# Patient Record
Sex: Female | Born: 1945
Health system: Southern US, Community
[De-identification: ages and names within clinical notes are randomized; demographics above are authoritative.]

## PROBLEM LIST (undated history)

## (undated) DIAGNOSIS — I10 Essential (primary) hypertension: Secondary | ICD-10-CM

## (undated) DIAGNOSIS — F329 Major depressive disorder, single episode, unspecified: Secondary | ICD-10-CM

## (undated) DIAGNOSIS — K5792 Diverticulitis of intestine, part unspecified, without perforation or abscess without bleeding: Secondary | ICD-10-CM

## (undated) DIAGNOSIS — E119 Type 2 diabetes mellitus without complications: Secondary | ICD-10-CM

## (undated) DIAGNOSIS — R32 Unspecified urinary incontinence: Secondary | ICD-10-CM

## (undated) HISTORY — DX: Type 2 diabetes mellitus without complications: E11.9

## (undated) HISTORY — PX: IR FALLOPIAN TUBE CATHETERIZATION: IMG633

## (undated) HISTORY — PX: EYE SURGERY: SHX253

## (undated) HISTORY — PX: TONSILLECTOMY: SUR1361

---

## 2011-12-18 ENCOUNTER — Other Ambulatory Visit: Payer: Self-pay

## 2011-12-18 ENCOUNTER — Encounter: Payer: Self-pay | Admitting: Emergency Medicine

## 2011-12-18 ENCOUNTER — Emergency Department (HOSPITAL_COMMUNITY)
Admission: EM | Admit: 2011-12-18 | Discharge: 2011-12-18 | Disposition: A | Discharge status: Home / Self Care | Payer: Medicare Other | Attending: Emergency Medicine | Admitting: Emergency Medicine

## 2011-12-18 DIAGNOSIS — I10 Essential (primary) hypertension: Secondary | ICD-10-CM | POA: Insufficient documentation

## 2011-12-18 DIAGNOSIS — F329 Major depressive disorder, single episode, unspecified: Secondary | ICD-10-CM | POA: Insufficient documentation

## 2011-12-18 DIAGNOSIS — M5412 Radiculopathy, cervical region: Secondary | ICD-10-CM | POA: Insufficient documentation

## 2011-12-18 DIAGNOSIS — M542 Cervicalgia: Secondary | ICD-10-CM | POA: Insufficient documentation

## 2011-12-18 DIAGNOSIS — M5030 Other cervical disc degeneration, unspecified cervical region: Secondary | ICD-10-CM | POA: Insufficient documentation

## 2011-12-18 DIAGNOSIS — M25519 Pain in unspecified shoulder: Secondary | ICD-10-CM | POA: Insufficient documentation

## 2011-12-18 DIAGNOSIS — Z79899 Other long term (current) drug therapy: Secondary | ICD-10-CM | POA: Insufficient documentation

## 2011-12-18 HISTORY — DX: Major depressive disorder, single episode, unspecified: F32.9

## 2011-12-18 HISTORY — DX: Essential (primary) hypertension: I10

## 2011-12-18 MED ORDER — DEXAMETHASONE 6 MG PO TABS: ORAL_TABLET | ORAL | Status: AC

## 2011-12-18 MED ORDER — DEXAMETHASONE SODIUM PHOSPHATE 4 MG/ML IJ SOLN
8.0000 mg | Freq: Once | INTRAMUSCULAR | Status: AC
  Administered 2011-12-18: 8 mg via INTRAMUSCULAR
  Filled 2011-12-18: qty 2

## 2011-12-18 MED ORDER — HYDROCODONE-ACETAMINOPHEN 5-325 MG PO TABS
2.0000 | ORAL_TABLET | Freq: Once | ORAL | Status: AC
  Administered 2011-12-18: 2 via ORAL
  Filled 2011-12-18: qty 2

## 2011-12-18 MED ORDER — HYDROCODONE-ACETAMINOPHEN 7.5-325 MG PO TABS: 1.0000 | ORAL_TABLET | ORAL | Status: AC | PRN

## 2011-12-18 MED ORDER — ONDANSETRON HCL 4 MG PO TABS
4.0000 mg | ORAL_TABLET | Freq: Once | ORAL | Status: AC
  Administered 2011-12-18: 4 mg via ORAL
  Filled 2011-12-18: qty 1

## 2011-12-18 NOTE — ED Notes (Signed)
Patient is alert and oriented x 4 with respirations even and unlabored.  NAD at this time.  Discharge instructions reviewed with patient and patient verbalized understanding.  Pt ambulated to lobby with steady gait, and friend to transport pt home.  

## 2011-12-18 NOTE — ED Notes (Signed)
Patient c/o right sided neck pain and right shoulder pain that comes and goes for a little over a week.  Denies chest pain, shortness of breath, N/V/D.

## 2011-12-18 NOTE — ED Provider Notes (Signed)
History     CSN: [id number]  Arrival date & time 12/18/11  2106   First MD Initiated Contact with Patient 12/18/11 2222      Chief Complaint  Patient presents with  . Neck Pain  . Shoulder Pain    (Consider location/radiation/quality/duration/timing/severity/associated sxs/prior treatment) HPI Comments: Patient reports history of degenerative cervical disc disease approximately 6-7 years ago. She was treated with steroid injections approximately a year ago. She has not had any major problems since that time. The patient now complains of right sided neck and shoulder area pain that seems to come and go has been worse over the past week. The patient denies dropping objects. She denies any excessive lifting, pushing, pulling, or repetitive use. She has not had previous surgical interventions involving the cervical spine or the right shoulder. The patient denies any chest pain, shortness of breath, sweats, nausea vomiting, or loss of consciousness.  Patient is a 65 y.o. female presenting with neck pain and shoulder pain. The history is provided by the patient.  Neck Pain  The current episode started more than 1 week ago. The problem occurs daily. The problem has been gradually worsening. The pain is associated with an unknown factor. There has been no fever. The pain is present in the right side. The quality of the pain is described as shooting and aching. The pain radiates to the right shoulder. The pain is severe. Exacerbated by: Certain movement or positions. The pain is the same all the time. Pertinent negatives include no photophobia, no chest pain, no syncope, no numbness, no headaches, no bowel incontinence and no bladder incontinence. She has tried nothing for the symptoms.  Shoulder Pain Associated symptoms include neck pain. Pertinent negatives include no abdominal pain, arthralgias, chest pain, coughing, headaches or numbness.    Past Medical History  Diagnosis Date  . Hypertension    . Depression, major     Past Surgical History  Procedure Date  . Eye surgery     No family history on file.  History  Substance Use Topics  . Smoking status: Former Games developer  . Smokeless tobacco: Not on file  . Alcohol Use: Yes    OB History    Grav Para Term Preterm Abortions TAB SAB Ect Mult Living                  Review of Systems  Constitutional: Negative for activity change.       All ROS Neg except as noted in HPI  HENT: Positive for neck pain. Negative for nosebleeds.   Eyes: Negative for photophobia and discharge.  Respiratory: Negative for cough, shortness of breath and wheezing.   Cardiovascular: Negative for chest pain, palpitations and syncope.  Gastrointestinal: Negative for abdominal pain, blood in stool and bowel incontinence.  Genitourinary: Negative for bladder incontinence, dysuria, frequency and hematuria.  Musculoskeletal: Negative for back pain and arthralgias.  Skin: Negative.   Neurological: Negative for dizziness, seizures, speech difficulty, numbness and headaches.  Psychiatric/Behavioral: Negative for hallucinations and confusion.    Allergies  Amoxicillin; Erythromycin; Sulfa antibiotics; and Tetanus toxoids  Home Medications   Current Outpatient Rx  Name Route Sig Dispense Refill  . ATENOLOL-CHLORTHALIDONE 50-25 MG PO TABS Oral Take 1 tablet by mouth daily.      Marland Kitchen BROMFENAC SODIUM (ONCE-DAILY) 0.09 % OP SOLN Ophthalmic Apply 1 drop to eye daily.      . BUPROPION HCL ER (SR) PO Oral Take by mouth daily.      Marland Kitchen  CLONAZEPAM PO Oral Take 1 tablet by mouth daily as needed. FOR ANXIETY     . PRILOSEC PO Oral Take 1 capsule by mouth daily.      Marland Kitchen PREDNISOLONE ACETATE 0.12 % OP SUSP Both Eyes Place 1 drop into both eyes 4 (four) times daily.      Marland Kitchen DETROL PO Oral Take 1 tablet by mouth daily.        BP 136/73  Pulse 63  Temp(Src) 98.1 F (36.7 C) (Oral)  Resp 20  Ht 5' 7.5" (1.715 m)  Wt 176 lb (79.833 kg)  BMI 27.16 kg/m2  SpO2  99%  Physical Exam  Nursing note and vitals reviewed. Constitutional: She is oriented to person, place, and time. She appears well-developed and well-nourished.  Non-toxic appearance.  HENT:  Head: Normocephalic.  Right Ear: Tympanic membrane and external ear normal.  Left Ear: Tympanic membrane and external ear normal.  Eyes: EOM and lids are normal. Pupils are equal, round, and reactive to light.  Neck: Normal range of motion. Neck supple. Carotid bruit is not present.  Cardiovascular: Normal rate, regular rhythm, normal heart sounds, intact distal pulses and normal pulses.  Exam reveals no gallop and no friction rub.   Pulmonary/Chest: Breath sounds normal. No stridor. No respiratory distress. She has no rales. She exhibits no tenderness.  Abdominal: Soft. Bowel sounds are normal. There is no tenderness. There is no guarding.  Musculoskeletal: Normal range of motion.       There is soreness with attempted range of motion of the right shoulder. There is increased tightness and tenseness from the insertion of the trapezius superiorly to the right shoulder. No hot joints appreciated. Distal pulses appreciated and symmetrical.  Lymphadenopathy:       Head (right side): No submandibular adenopathy present.       Head (left side): No submandibular adenopathy present.    She has no cervical adenopathy.  Neurological: She is alert and oriented to person, place, and time. She has normal strength. No cranial nerve deficit or sensory deficit.  Skin: Skin is warm and dry.  Psychiatric: She has a normal mood and affect. Her speech is normal.    ED Course  Procedures (including critical care time) Pulse oximetry 99% on room air. Within normal limits by my interpretation. Labs Reviewed - No data to display No results found. EKG: Time: 9:20 p.m. Rate. 61. Rhythm. Sinus rhythm with premature ventricular complexes. P waves. Left atrial enlargement. QRS: Slow R-wave progression V1 through V4. No STEMI.  No apparent life-threatening event.  Dx: 1. Cervical radiculopathy   MDM  I have reviewed nursing notes, vital signs, and all appropriate lab and imaging results for this patient.   Pt to see her MD in [place], [place]. For recheck next week. Rx for decadron and norco given.EKG reviewed with patient. Patient states she has a history of PVCs, and EKG changes, and these are not new.     Kathie Dike, PA 12/18/11 2312  Kathie Dike, [place] 12/19/11 [phone]

## 2011-12-19 NOTE — ED Provider Notes (Signed)
Medical screening examination/treatment/procedure(s) were performed by non-physician practitioner and as supervising physician I was immediately available for consultation/collaboration.   Osmara Drummonds L Savita Runner, MD 12/19/11 1524 

## 2020-07-19 ENCOUNTER — Other Ambulatory Visit (HOSPITAL_COMMUNITY): Payer: Self-pay | Admitting: Family Medicine

## 2020-07-19 DIAGNOSIS — Z780 Asymptomatic menopausal state: Secondary | ICD-10-CM

## 2020-07-26 ENCOUNTER — Ambulatory Visit (HOSPITAL_COMMUNITY)
Admission: RE | Admit: 2020-07-26 | Discharge: 2020-07-26 | Disposition: A | Discharge status: Home / Self Care | Payer: Medicare PPO | Source: Ambulatory Visit | Attending: Family Medicine | Admitting: Family Medicine

## 2020-07-26 ENCOUNTER — Other Ambulatory Visit: Payer: Self-pay

## 2020-07-26 DIAGNOSIS — Z780 Asymptomatic menopausal state: Secondary | ICD-10-CM | POA: Diagnosis not present

## 2020-08-11 ENCOUNTER — Ambulatory Visit
Admission: EM | Admit: 2020-08-11 | Discharge: 2020-08-11 | Disposition: A | Discharge status: Home / Self Care | Payer: Medicare PPO | Attending: Family Medicine | Admitting: Family Medicine

## 2020-08-11 ENCOUNTER — Encounter: Payer: Self-pay | Admitting: Emergency Medicine

## 2020-08-11 DIAGNOSIS — R82998 Other abnormal findings in urine: Secondary | ICD-10-CM | POA: Insufficient documentation

## 2020-08-11 DIAGNOSIS — R1032 Left lower quadrant pain: Secondary | ICD-10-CM | POA: Insufficient documentation

## 2020-08-11 HISTORY — DX: Unspecified urinary incontinence: R32

## 2020-08-11 LAB — POCT URINALYSIS DIP (MANUAL ENTRY)
Blood, UA: NEGATIVE
Glucose, UA: NEGATIVE mg/dL
Nitrite, UA: NEGATIVE
Protein Ur, POC: 30 mg/dL — AB
Spec Grav, UA: >=1.03 — AB (ref 1.010–1.025)
Urobilinogen, UA: 0.2 E.U./dL
pH, UA: 6 (ref 5.0–8.0)

## 2020-08-11 NOTE — ED Provider Notes (Signed)
[facility]   CC: UTI  SUBJECTIVE:  Lindsay Wilkerson is a 74 y.o. female who complains of dark urine for the last 3 days. Reports that she is on antibiotics for diverticulitis. Patient denies a precipitating event, recent sexual encounter, excessive caffeine intake. Has not taken OTC medications for this.  Symptoms are made worse with urination. Admits to similar symptoms in the past. Denies fever, chills, nausea, vomiting, abdominal pain, flank pain, abnormal vaginal discharge or bleeding, hematuria.    LMP: No LMP recorded. Patient is postmenopausal.  ROS: As in HPI.  All other pertinent ROS negative.     Past Medical History:  Diagnosis Date   Depression, major    Hypertension    Urinary bladder incontinence    Past Surgical History:  Procedure Laterality Date   EYE SURGERY     Allergies  Allergen Reactions   Amoxicillin    Erythromycin    Sulfa Antibiotics    Tetanus Toxoids    Tussionex Pennkinetic Er [Hydrocod Polst-Cpm Polst Er] Itching   No current facility-administered medications on file prior to encounter.   Current Outpatient Medications on File Prior to Encounter  Medication Sig Dispense Refill   atenolol-chlorthalidone (TENORETIC) 50-25 MG per tablet Take 1 tablet by mouth daily.       Bromfenac Sodium (BROMDAY) 0.09 % SOLN Apply 1 drop to eye daily.       BUPROPION HCL ER, SR, PO Take by mouth daily.       CLONAZEPAM PO Take 1 tablet by mouth daily as needed. FOR ANXIETY      Omeprazole (PRILOSEC PO) Take 1 capsule by mouth daily.       prednisoLONE acetate (PRED MILD) 0.12 % ophthalmic suspension Place 1 drop into both eyes 4 (four) times daily.       Tolterodine Tartrate (DETROL PO) Take 1 tablet by mouth daily.       Social History   Socioeconomic History   Marital status: Divorced    Spouse name: Not on file   Number of children: Not on file   Years of education: Not on file   Highest education level: Not on file    Occupational History   Not on file  Tobacco Use   Smoking status: Former Smoker  Substance and Sexual Activity   Alcohol use: Yes   Drug use: No   Sexual activity: Not on file  Other Topics Concern   Not on file  Social History Narrative   Not on file   Social Determinants of Health   Financial Resource Strain:    Difficulty of Paying Living Expenses: Not on file  Food Insecurity:    Worried About [employer] in the Last Year: Not on file   Ran Out of Food in the Last Year: Not on file  Transportation Needs:    Lack of Transportation (Medical): Not on file   Lack of Transportation (Non-Medical): Not on file  Physical Activity:    Days of Exercise per Week: Not on file   Minutes of Exercise per Session: Not on file  Stress:    Feeling of Stress : Not on file  Social Connections:    Frequency of Communication with Friends and Family: Not on file   Frequency of Social Gatherings with Friends and Family: Not on file   Attends Religious Services: Not on file   Active Member of Clubs or Organizations: Not on file   Attends Archivist Meetings: Not on file  Marital Status: Not on file  Intimate Partner Violence:    Fear of Current or Ex-Partner: Not on file   Emotionally Abused: Not on file   Physically Abused: Not on file   Sexually Abused: Not on file   No family history on file.  OBJECTIVE:  Vitals:   08/11/20 1954 08/11/20 1956  BP:  127/79  Pulse:  66  Resp:  19  Temp:  98.8 F (37.1 C)  TempSrc:  Oral  SpO2:  97%  Weight: 202 lb (91.6 kg)   Height: 5\' 8"  (1.727 m)    General appearance: AOx3 in no acute distress HEENT: NCAT. Oropharynx clear.  Lungs: clear to auscultation bilaterally without adventitious breath sounds Heart: regular rate and rhythm. Radial pulses 2+ symmetrical bilaterally Abdomen: soft; non-distended; no tenderness; bowel sounds present; no guarding or rebound tenderness Back: no CVA  tenderness Extremities: no edema; symmetrical with no gross deformities Skin: warm and dry Neurologic: Ambulates from chair to exam table without difficulty Psychological: alert and cooperative; normal mood and affect  Labs Reviewed  POCT URINALYSIS DIP (MANUAL ENTRY) - Abnormal; Notable for the following components:      Result Value   Color, UA straw (*)    Bilirubin, UA small (*)    Ketones, POC UA trace (5) (*)    Spec Grav, UA >=1.030 (*)    Protein Ur, POC =30 (*)    Leukocytes, UA Trace (*)    All other components within normal limits  URINE CULTURE  POCT URINALYSIS DIP (MANUAL ENTRY)    ASSESSMENT & PLAN:  1. Dark urine   2. Left lower quadrant abdominal pain     Urine culture sent  We will call you with abnormal results that need further treatment Push fluids and get plenty of rest Take pyridium as prescribed and as needed for symptomatic relief Follow up with PCP if symptoms persists Return here or go to ER if you have any new or worsening symptoms such as fever, worsening abdominal pain, nausea/vomiting, flank pain  Outlined signs and symptoms indicating need for more acute intervention Patient verbalized understanding After Visit Summary given     Faustino Congress, NP 08/13/20 [phone]

## 2020-08-11 NOTE — Discharge Instructions (Addendum)
You may have a urinary tract infection. We are going to culture your urine and will call you as soon as we have the results.   Drink plenty of water, 8-10 glasses per day.   You may take AZO over the counter for painful urination.  Follow up with your primary care provider as needed.   Go to the Emergency Department if you experience severe pain, shortness of breath, high fever, or other concerns.   

## 2020-08-11 NOTE — ED Triage Notes (Signed)
Pt states she is on abx for diverticulitis. States her urine looks brown looking in the toilet. Denies burning or increased frequency.  Does report some vaginal itching that has been going on a while.

## 2020-08-14 LAB — URINE CULTURE: Culture: NO GROWTH

## 2020-10-05 ENCOUNTER — Other Ambulatory Visit: Payer: Self-pay

## 2020-10-05 ENCOUNTER — Ambulatory Visit (HOSPITAL_COMMUNITY)
Admission: RE | Admit: 2020-10-05 | Discharge: 2020-10-05 | Disposition: A | Discharge status: Home / Self Care | Payer: Medicare PPO | Source: Ambulatory Visit | Attending: Family Medicine | Admitting: Family Medicine

## 2020-10-05 ENCOUNTER — Other Ambulatory Visit (HOSPITAL_COMMUNITY): Payer: Self-pay | Admitting: Family Medicine

## 2020-10-05 ENCOUNTER — Other Ambulatory Visit: Payer: Self-pay | Admitting: Family Medicine

## 2020-10-05 ENCOUNTER — Encounter (HOSPITAL_COMMUNITY): Payer: Self-pay | Admitting: Radiology

## 2020-10-05 DIAGNOSIS — K5792 Diverticulitis of intestine, part unspecified, without perforation or abscess without bleeding: Secondary | ICD-10-CM

## 2020-10-05 MED ORDER — IOHEXOL 9 MG/ML PO SOLN
ORAL | Status: AC
  Filled 2020-10-05: qty 1000

## 2020-10-05 MED ORDER — IOHEXOL 300 MG/ML  SOLN
100.0000 mL | Freq: Once | INTRAMUSCULAR | Status: AC | PRN
  Administered 2020-10-05: 100 mL via INTRAVENOUS

## 2020-11-07 ENCOUNTER — Ambulatory Visit (INDEPENDENT_AMBULATORY_CARE_PROVIDER_SITE_OTHER): Payer: Medicare PPO | Admitting: Clinical

## 2020-11-07 ENCOUNTER — Other Ambulatory Visit: Payer: Self-pay

## 2020-11-07 DIAGNOSIS — F419 Anxiety disorder, unspecified: Secondary | ICD-10-CM | POA: Diagnosis not present

## 2020-11-07 DIAGNOSIS — F331 Major depressive disorder, recurrent, moderate: Secondary | ICD-10-CM
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
## 2020-11-13 ENCOUNTER — Encounter (INDEPENDENT_AMBULATORY_CARE_PROVIDER_SITE_OTHER): Payer: Self-pay | Admitting: Gastroenterology

## 2020-11-13 ENCOUNTER — Other Ambulatory Visit: Payer: Self-pay

## 2020-11-13 ENCOUNTER — Ambulatory Visit (INDEPENDENT_AMBULATORY_CARE_PROVIDER_SITE_OTHER): Payer: Medicare PPO | Admitting: Gastroenterology

## 2020-11-13 DIAGNOSIS — R1032 Left lower quadrant pain: Secondary | ICD-10-CM | POA: Insufficient documentation

## 2020-11-13 MED ORDER — NAPROXEN 500 MG PO TABS: 500.0000 mg | ORAL_TABLET | Freq: Two times a day (BID) | ORAL | 0 refills | Status: DC

## 2020-11-13 NOTE — Progress Notes (Signed)
Maylon Peppers, M.D. Gastroenterology & Hepatology [facility] [address] [place], [place] [address] Primary Care Physician: Youlanda Roys, MD [place] [address]  Referring MD: PCP  I will communicate my assessment and recommendations to the referring MD via EMR. Note: Occasional unusual wording and randomly placed punctuation marks may result from the use of speech recognition technology to transcribe this document"  Chief Complaint:  PCP  History of Present Illness: Lindsay Wilkerson is a 74 y.o. female with PMH diverticulitis x2, depression and HTN, who presents for evaluation of LLQ pain.  Patient reports that she has had chronic abdominal pain in her LLQ since 03/2020, which is present during most of the whole day, but gets worse with leaning on the left side of her body and in the morning.  She actually reported that she moves her body the pain exacerbates. It does not bother her while sleeping. States that the pain radiates to her L flank and LUQ. The patient states the pain is 6/10 in intensity and describes it as a pressure. She usually does not take something for the pain but has tried Tylenol a couple of times in the past. Never had similar symptoms in the past prior to symptom onset.  She reports that in July 2021 she was prescribed two antibiotics by her PCP for pain management for presumptive diverticulitis, but she did not have any imaging done at that time - did not feel any improvement with the medication.  The patient denies having any nausea, vomiting, fever, chills, hematochezia, melena, hematemesis, abdominal distention, diarrhea, jaundice, pruritus or weight loss.  Patient had abdominal imaging performed recently as part of the investigation of her persistent abdominal pain which reports CT abdomen with IV contrast 10/05/2020 IMPRESSION: 1. Mild circumferential thickening of the urinary bladder  wall, possibly related to underdistention.  2. Colonic diverticulosis without evidence of acute diverticulitis. 3. No other acute abdominopelvic abnormality to provide cause for patient's symptoms. 4. Aortic Atherosclerosis (ICD10-I70.0).  Last HCW:CBJSE Last Colonoscopy:2018 - had polyps and advised to repeat in 5 years.  FHx: neg for any gastrointestinal/liver disease, sister breast cancer Social: quit smoking in 2001, neg alcohol or illicit drug use Surgical: partial fallopian tube removed  Past Medical History: Past Medical History:  Diagnosis Date  . Depression, major   . Hypertension   . Urinary bladder incontinence     Past Surgical History: Past Surgical History:  Procedure Laterality Date  . EYE SURGERY      Family History:No family history on file.  Social History: Social History   Tobacco Use  Smoking Status Former Smoker   Social History   Substance and Sexual Activity  Alcohol Use Yes   Social History   Substance and Sexual Activity  Drug Use No    Allergies: Allergies  Allergen Reactions  . Amoxicillin   . Erythromycin   . Sulfa Antibiotics   . Tetanus Toxoids   . Tussionex Pennkinetic Er [Hydrocod Polst-Cpm Polst Er] Itching    Medications: Current Outpatient Medications  Medication Sig Dispense Refill  . atenolol-chlorthalidone (TENORETIC) 50-25 MG per tablet Take 1 tablet by mouth daily.      . Bromfenac Sodium (BROMDAY) 0.09 % SOLN Apply 1 drop to eye daily.      . BUPROPION HCL ER, SR, PO Take by mouth daily.      Marland Kitchen CLONAZEPAM PO Take 1 tablet by mouth daily as needed. FOR ANXIETY     .  Omeprazole (PRILOSEC PO) Take 1 capsule by mouth daily.      . prednisoLONE acetate (PRED MILD) 0.12 % ophthalmic suspension Place 1 drop into both eyes 4 (four) times daily.      . Tolterodine Tartrate (DETROL PO) Take 1 tablet by mouth daily.       No current facility-administered medications for this visit.    Review of Systems: GENERAL:  negative for malaise, night sweats HEENT: No changes in hearing or vision, no nose bleeds or other nasal problems. NECK: Negative for lumps, goiter, pain and significant neck swelling RESPIRATORY: Negative for cough, wheezing CARDIOVASCULAR: Negative for chest pain, leg swelling, palpitations, orthopnea GI: SEE HPI MUSCULOSKELETAL: Negative for joint pain or swelling, back pain, and muscle pain. SKIN: Negative for lesions, rash PSYCH: Negative for sleep disturbance, mood disorder and recent psychosocial stressors. HEMATOLOGY Negative for prolonged bleeding, bruising easily, and swollen nodes. ENDOCRINE: Negative for cold or heat intolerance, polyuria, polydipsia and goiter. NEURO: negative for tremor, gait imbalance, syncope and seizures. The remainder of the review of systems is noncontributory.   Physical Exam: BP 125/82 (BP Location: Right Arm, Patient Position: Sitting, Cuff Size: Large)   Pulse 71   Temp 98.3 F (36.8 C) (Oral)   Ht 5\' 8"  (1.727 m)   Wt 209 lb (94.8 kg)   BMI 31.78 kg/m  GENERAL: The patient is AO x3, in no acute distress. HEENT: Head is normocephalic and atraumatic. EOMI are intact. Mouth is well hydrated and without lesions. NECK: Supple. No masses LUNGS: Clear to auscultation. No presence of rhonchi/wheezing/rales. Adequate chest expansion HEART: RRR, normal s1 and s2. ABDOMEN: Mildly tender to palpation in the left supra inguinal area, no guarding, no peritoneal signs, and nondistended. BS +. No masses. EXTREMITIES: Without any cyanosis, clubbing, rash, lesions or edema. NEUROLOGIC: AOx3, no focal motor deficit. SKIN: no jaundice, no rashes   Imaging/Labs: as above  I personally reviewed and interpreted the available labs, imaging and endoscopic files.  Impression and Plan: Lindsay Wilkerson is a 74 y.o. female with PMH diverticulitis x2, depression and HTN, who presents for evaluation of LLQ pain.  Patient has presented persistent symptoms of left  lower quadrant abdominal pain.  Based on the description of her symptoms, it is most likely that the etiology is related to a muscular skeletal source as the pain is exacerbated with movements.  She had recent abdominal imaging that was negative for any alterations explain her symptoms, while she had a relatively recent colonoscopy 3 years ago that did not show any major abnormalities.  She has not presented any red flag signs.  Due to this, we will give her a trial of NSAIDs for 3 weeks while she is taking her Prilosec daily.  If she does not have any significant improvement of her symptoms, alternative school be trying an antispasmodic such as Bentyl for management of irritable bowel syndrome.  The patient understood and agreed.  -Start naproxen 500 mg BID trial for 3 weeks - Continue Prilosec daily - RTC 2 months  All questions were answered.      Maylon Peppers, MD Gastroenterology and Hepatology [facility]

## 2020-11-13 NOTE — Patient Instructions (Addendum)
Start Aleve trial for 3 weeks Continue Prilosec daily

## 2020-11-30 ENCOUNTER — Other Ambulatory Visit: Payer: Self-pay

## 2020-11-30 ENCOUNTER — Ambulatory Visit (INDEPENDENT_AMBULATORY_CARE_PROVIDER_SITE_OTHER): Payer: Medicare PPO | Admitting: Clinical

## 2020-11-30 DIAGNOSIS — F331 Major depressive disorder, recurrent, moderate: Secondary | ICD-10-CM | POA: Diagnosis not present

## 2020-11-30 DIAGNOSIS — F419 Anxiety disorder, unspecified: Secondary | ICD-10-CM | POA: Diagnosis not present

## 2020-11-30 NOTE — Progress Notes (Signed)
  Virtual Visit via Telephone Note  I connected withOllie Wilkerson 12/09/21at 11:00 AM ESTby telephoneand verified that I am speaking with the correct person using two identifiers.  Location: Patient:Home Provider:Office  I discussed the limitations, risks, security and privacy concerns of performing an evaluation and management service by telephone and the availability of in person appointments. I also discussed with the patient that there may be a patient responsible charge related to this service. The patient expressed understanding and agreed to proceed.   THERAPIST PROGRESS NOTE  Session Time:11:00AM-11:45PM  Participation Level:Active  Behavioral Response:CasualAlertDepressed  Type of Therapy:Individual Therapy  Treatment Goals addressed:Coping  Interventions:Motivational Interviewing, Solution Focused, Strength-based, Supportive and Anger Management Training  Summary:Lindsay Gatherightis [phone] presents withDepression and Anxiety.The OPT therapist worked with thepatientfor herinitial OPT treatment. The OPT therapist utilized Motivational Interviewing to assist in creating therapeutic repore. The patient in the session was engaged and work in Science writer about hertriggers and symptoms over the past few weeksincludingher difficulty and stress around seeking a serious relationship and adjusting to recently moving.The OPT therapist worked reviewingcoping strategies to assist in management ofDepression/Anxiety.The OPT therapist worked with the patient to examine herutilization ofsupport system and promoted leisure/self care.The OPT therapist provided psycho-education and support throughout the treatment session.  Suicidal/Homicidal:Nowithout intent/plan  Therapist Response:. TheOPT therapist gained feedback through utilizing [place] into the patientstriggers, symptoms, and behavior  responses over the pastfewweeks.The OPT therapist was able to getfeedbackabout the patientsdifficultywith adjusting to recently moving and finding compatibility in seeking a serious relationship.The OPT therapist worked with the patient using Cognitive Behavioral Therapy exercise within the session. The patientverbalized that she hasbeen working to organize her home so that she can be more social and invite others over to her home.The OPT therapist continued to work with the patient on positive thinkingand giving herself grace and time to organize after just recently moving.The OPT therapist will continue treatment work with the patient in hernext scheduled session.   Plan: Return again in2/3weeks.  Diagnosis:Axis I:Recurrent moderate major depressive disorder with anxiety  Axis II:No diagnosis  I discussed the assessment and treatment plan with the patient. The patient was provided an opportunity to ask questions and all were answered. The patient agreed with the plan and demonstrated an understanding of the instructions.  The patient was advised to call back or seek an in-person evaluation if the symptoms worsen or if the condition fails to improve as anticipated.  I provided52minutes of non-face-to-face time during this encounter.  Lindsay Grumbles, LCSW 11/30/2020

## 2020-12-01 ENCOUNTER — Other Ambulatory Visit (INDEPENDENT_AMBULATORY_CARE_PROVIDER_SITE_OTHER): Payer: Self-pay | Admitting: Gastroenterology

## 2020-12-01 DIAGNOSIS — R1032 Left lower quadrant pain: Secondary | ICD-10-CM

## 2020-12-01 NOTE — Telephone Encounter (Signed)
No further refills are appropiate, medication only meant to be given for 3 weeks.  Thanks  Maylon Peppers, MD Gastroenterology and Hepatology [facility]

## 2020-12-21 ENCOUNTER — Ambulatory Visit (INDEPENDENT_AMBULATORY_CARE_PROVIDER_SITE_OTHER): Payer: Medicare PPO | Admitting: Clinical

## 2020-12-21 ENCOUNTER — Other Ambulatory Visit: Payer: Self-pay

## 2020-12-21 DIAGNOSIS — F419 Anxiety disorder, unspecified: Secondary | ICD-10-CM

## 2020-12-21 DIAGNOSIS — F331 Major depressive disorder, recurrent, moderate: Secondary | ICD-10-CM | POA: Diagnosis not present

## 2020-12-21 NOTE — Progress Notes (Signed)
Virtual Visit via Video Note  I connected with Lindsay Wilkerson on 12/21/20 at  2:00 PM EST by a video enabled telemedicine application and verified that I am speaking with the correct person using two identifiers.  Location: Patient: Home Provider: Office   I discussed the limitations of evaluation and management by telemedicine and the availability of in person appointments. The patient expressed understanding and agreed to proceed.    THERAPIST PROGRESS NOTE  Session Time:2:00PM - 2:55PM  Participation Level:Active  Behavioral Response:CasualAlertDepressed  Type of Therapy:Individual Therapy  Treatment Goals addressed:Coping  Interventions:Motivational Interviewing, Solution Focused, Strength-based, Supportive and Anger Management Training  Summary:Lindsay Gatherightis [phone].o.femalewho presents withDepression and Anxiety.The OPT therapist worked with thepatientfor herongoing OPT treatment. The OPT therapist utilized Motivational Interviewing to assist in creating therapeutic repore. The patient in the session was engaged and work in Tour manager about hertriggers and symptoms over the past few weeksincludingherdifficulty and stress around ongoing involvement with a female friend who she enjoys the company of but is not on the same page as the patient who is seeking a more traditional monogamous relationship.The OPT therapist worked reviewingcoping strategies to assist in management ofDepression/Anxiety.The OPT therapist worked with the patient to examine herutilization ofsupport systemand promoted leisure/self care.The OPT therapist provided psycho-education and support throughout the treatment session.  Suicidal/Homicidal:Nowithout intent/plan  Therapist Response:. TheOPT therapist gained feedback through utilizing Motivational Interviewing into the patientstriggers, symptoms, and behavior responses over the  pastfewweeks.The OPT therapist was able to getfeedbackabout the patientsdifficultywithadjusting to recently moving and feel of losing the companionship she is getting from her involvement with a female shes interested in, but knowing the person is not a good fit for what she is ultimately looking for in seeking a serious relationship.The OPT therapist worked with the patient using Cognitive Behavioral Therapy exercise within the session. The patientverbalized that she is looking to be more social and invite others over to her home.The OPT therapist continued to work with the patient on positive thinkingandgiving herself a chance to cultivate social relationships to fill her desire for socialization and from this she may find companionship.The OPT therapist will continue treatment work with the patient in hernext scheduled session.   Plan: Return again in2/3weeks.  Diagnosis:Axis I:Recurrent moderate major depressive disorder with anxiety  Axis II:No diagnosis  I discussed the assessment and treatment plan with the patient. The patient was provided an opportunity to ask questions and all were answered. The patient agreed with the plan and demonstrated an understanding of the instructions.  The patient was advised to call back or seek an in-person evaluation if the symptoms worsen or if the condition fails to improve as anticipated.  I provided20minutes of non-face-to-face time during this encounter.  Winfred Burn, LCSW 12/21/2020

## 2021-01-11 ENCOUNTER — Ambulatory Visit (INDEPENDENT_AMBULATORY_CARE_PROVIDER_SITE_OTHER): Payer: Medicare PPO | Admitting: Clinical

## 2021-01-11 ENCOUNTER — Telehealth (HOSPITAL_COMMUNITY): Payer: Self-pay | Admitting: Clinical

## 2021-01-11 ENCOUNTER — Other Ambulatory Visit: Payer: Self-pay

## 2021-01-11 DIAGNOSIS — F331 Major depressive disorder, recurrent, moderate: Secondary | ICD-10-CM

## 2021-01-11 DIAGNOSIS — F419 Anxiety disorder, unspecified: Secondary | ICD-10-CM

## 2021-01-11 NOTE — Telephone Encounter (Signed)
Called patient due to provider advising pt was "upset" and had to be de-escalated because her appt time changed from 3pm 1/18 to 8am 1/19. I called pt immediately after her 8am appt, due to providers statement. Patient advised she was not upset at all and will mention to provider on the next appt., adding that she was fine and had just asked about original time that was given.

## 2021-01-11 NOTE — Progress Notes (Signed)
  Virtual Visit via Video Note  I connected withOllie Wilkerson on 01/11/21 at  8:00 AM EST by a video enabled telemedicine application and verified that I am speaking with the correct person using two identifiers.  Location: Patient: Home Provider: Office  I discussed the limitations of evaluation and management by telemedicine and the availability of in person appointments. The patient expressed understanding and agreed to proceed.    THERAPIST PROGRESS NOTE  Session Time:8:00AM - 8:55AM  Participation Level:Active  Behavioral Response:CasualAlertDepressed  Type of Therapy:Individual Therapy  Treatment Goals addressed:Coping  Interventions:Motivational Interviewing, Solution Focused, Strength-based, Supportive and Anger Management Training  Summary:Lindsay Wilkerson [phone].o.femalewho presents withDepression and Anxiety.The OPT therapist worked with thepatientfor herongoing OPT treatment. The OPT therapist utilized Motivational Interviewing to assist in creating therapeutic repore. The patient in the session was engaged and work in Science writer about hertriggers and symptoms over the past few weeksincludingherdifficulty and stress around ongoing involvement with a female friend who she enjoys the company of but is not on the same page as the patient who is seeking a more traditional monogamous relationship.The patient indicated that her female friend came gave her a gift and she has not heard from him for 2 weeks. The pt friend was suppose to show up the following day, however, left her hanging. The patient notes she has come up with the terms I have been love bombed and ghosted. The OPT therapist worked reviewingcoping strategies to assist in management ofDepression/Anxiety.The OPT therapist worked with the patient to examine herutilization ofsupport systemand promoted leisure/self care.The OPT therapist examined the danger of her  interaction with her female friend and how this is triggering and effecting her mood. The OPT therapist provided psycho-education and support throughout the treatment session.  Suicidal/Homicidal:Nowithout intent/plan  Therapist Response:. TheOPT therapist gained feedback through utilizing [place] into the patientstriggers, symptoms, and behavior responses over the pastfewweeks.The OPT therapist was able to getfeedbackabout the patientsdifficultywithadjusting to the ongoing ups and downs of her interaction with her female friend.The OPT therapist worked with the patient using Cognitive Behavioral Therapy exercise within the session.The patientverbalized that she is looking to be more social and invite others over to her home.The OPT therapist continued to work with the patient on positive thinkingandgiving herself a chance to cultivate social relationships to fill her desire for socialization and from this she may find companionship. The OPT therapist reviewed the risk of ongoing emotional abuse in staying connected and continuing to interact with her female friend.The OPT therapist will continue treatment work with the patient in hernext scheduled session.   Plan: Return again in2/3weeks.  Diagnosis:Axis I:Recurrent moderate major depressive disorder with anxiety  Axis II:No diagnosis  I discussed the assessment and treatment plan with the patient. The patient was provided an opportunity to ask questions and all were answered. The patient agreed with the plan and demonstrated an understanding of the instructions.  The patient was advised to call back or seek an in-person evaluation if the symptoms worsen or if the condition fails to improve as anticipated.  I provided30minutes of non-face-to-face time during this encounter.  Lindsay Grumbles, LCSW  01/11/2021

## 2021-02-01 ENCOUNTER — Ambulatory Visit (INDEPENDENT_AMBULATORY_CARE_PROVIDER_SITE_OTHER): Payer: Medicare PPO | Admitting: Clinical

## 2021-02-01 ENCOUNTER — Other Ambulatory Visit: Payer: Self-pay

## 2021-02-01 DIAGNOSIS — F331 Major depressive disorder, recurrent, moderate: Secondary | ICD-10-CM

## 2021-02-01 DIAGNOSIS — F419 Anxiety disorder, unspecified: Secondary | ICD-10-CM

## 2021-02-01 NOTE — Progress Notes (Signed)
  Virtual Visit via Video Note  I connected withOllie Gatherighton2/10/22at 8:00 AM ESTby a video enabled telemedicine application and verified that I am speaking with the correct person using two identifiers.  Location: Patient:Home Provider:Office  I discussed the limitations of evaluation and management by telemedicine and the availability of in person appointments. The patient expressed understanding and agreed to proceed.    THERAPIST PROGRESS NOTE  Session Time:8:00AM- 8:45AM  Participation Level:Active  Behavioral Response:CasualAlertDepressed  Type of Therapy:Individual Therapy  Treatment Goals addressed:Coping  Interventions:Motivational Interviewing, Solution Focused, Strength-based, Supportive and Anger Management Training  Summary:Lindsay Gatherightis [phone].o.femalewho presents withDepression and Anxiety.The OPT therapist worked with thepatientfor herongoingOPT treatment. The OPT therapist utilized Motivational Interviewing to assist in creating therapeutic repore. The patient in the session was engaged and work in Science writer about hertriggers and symptoms over the past few weeksincludingherdifficulty and stress aroundongoing involvement with a female friend who she enjoys the company of but is not on the same page as the patient who is seeking a more traditional monogamous relationship as well as difficulty setting her boundaries with another lady friend who monopolizes the patients time.. The OPT therapist worked reviewingcoping strategies to assist in management ofDepression/Anxiety.The OPT therapist worked with the patient to examine herutilization ofsupport systemand promoted leisure/self care. The OPT therapist worked with the patient on setting healthy boundaries in her relationship. The OPT therapist provided psycho-education and support throughout the treatment session.  Suicidal/Homicidal:Nowithout  intent/plan  Therapist Response:. TheOPT therapist gained feedback through utilizing [place] into the patientstriggers, symptoms, and behavior responses over the pastfewweeks.The OPT therapist was able to getfeedbackabout the patientsdifficultywithadjusting to the ongoing ups and downs of her interaction with her female friend and a draining situation with one of her girlfriends.The OPT therapist worked with the patient using Cognitive Behavioral Therapy exercise within the session.The OPT therapist continued to work with the patient on positive thinking. Thpatient .The OPT therapist will continue treatment work with the patient in hernext scheduled session.   Plan: Return again in2/3weeks.  Diagnosis:Axis I:Recurrent moderate major depressive disorder with anxiety  Axis II:No diagnosis  I discussed the assessment and treatment plan with the patient. The patient was provided an opportunity to ask questions and all were answered. The patient agreed with the plan and demonstrated an understanding of the instructions.  The patient was advised to call back or seek an in-person evaluation if the symptoms worsen or if the condition fails to improve as anticipated.  I provided16minutes of non-face-to-face time during this encounter.  Lindsay Grumbles, LCSW  02/01/2021

## 2021-02-05 ENCOUNTER — Ambulatory Visit (INDEPENDENT_AMBULATORY_CARE_PROVIDER_SITE_OTHER): Payer: Medicare PPO | Admitting: Gastroenterology

## 2021-02-22 ENCOUNTER — Ambulatory Visit (INDEPENDENT_AMBULATORY_CARE_PROVIDER_SITE_OTHER): Payer: Medicare PPO | Admitting: Clinical

## 2021-02-22 ENCOUNTER — Other Ambulatory Visit: Payer: Self-pay

## 2021-02-22 DIAGNOSIS — F331 Major depressive disorder, recurrent, moderate: Secondary | ICD-10-CM

## 2021-02-22 DIAGNOSIS — F419 Anxiety disorder, unspecified: Secondary | ICD-10-CM | POA: Diagnosis not present

## 2021-02-22 NOTE — Progress Notes (Signed)
Virtual Visit via Telephone Note  I connected with Lindsay Wilkerson on 02/22/21 at  8:00 AM EST by telephone and verified that I am speaking with the correct person using two identifiers.  Location: Patient: Home Provider: Office   I discussed the limitations, risks, security and privacy concerns of performing an evaluation and management service by telephone and the availability of in person appointments. I also discussed with the patient that there may be a patient responsible charge related to this service. The patient expressed understanding and agreed to proceed.  THERAPIST PROGRESS NOTE  Session Time:8:00AM-8:45AM  Participation Level:Active  Behavioral Response:CasualAlertDepressed  Type of Therapy:Individual Therapy  Treatment Goals addressed:Coping  Interventions:Motivational Interviewing, Solution Focused, Strength-based, Supportive and Anger Management Training  Summary:Shye Gatherightis [phone].o.femalewho presents withDepression and Anxiety.The OPT therapist worked with thepatientfor herongoingOPT treatment. The OPT therapist utilized Motivational Interviewing to assist in creating therapeutic repore. The patient in the session was engaged and work in Science writer about hertriggers and symptoms over the past few weeksincludingherdifficulty and stress aroundongoing involvement with a female friend who she enjoys the company of but is not on the same page as the patient who is seeking a more traditional monogamous relationship.The patient is currently seeking a healthier involvement and relationship with someone new.The OPT therapist worked reviewingcoping strategies to assist in management ofDepression/Anxiety.The OPT therapist worked with the patient to examine herutilization ofsupport systemand promoted leisure/self care. The OPT therapist worked with the patient on making sure she aligns her needs with someone who is looking  for a more tradition relationship.The OPT therapist provided psycho-education and support throughout the treatment session.  Suicidal/Homicidal:Nowithout intent/plan  Therapist Response:. TheOPT therapist gained feedback through utilizing [place] into the patientstriggers, symptoms, and behavior responses over the pastfewweeks.The OPT therapist was able to getfeedbackabout the patientsdifficultywith feeling her socialization need outside getting this met by the guy she has been involved with previously who she is working still to move forward from.The OPT therapist worked with the patient using Cognitive Behavioral Therapy exercise within the session.The OPT therapist continued to work with the patient on positive thinking. The patient verbalized her understanding of the risk of not filling her socialization needs in a friend or relationship with someone who better aligns with what the patient is looking for in a friendship/relationship.The OPT therapist will continue treatment work with the patient in hernext scheduled session.   Plan: Return again in2/3weeks.  Diagnosis:Axis I:Recurrent moderate major depressive disorder with anxiety  Axis II:No diagnosis  I discussed the assessment and treatment plan with the patient. The patient was provided an opportunity to ask questions and all were answered. The patient agreed with the plan and demonstrated an understanding of the instructions.  The patient was advised to call back or seek an in-person evaluation if the symptoms worsen or if the condition fails to improve as anticipated.  I provided39mnutes of non-face-to-face time during this encounter.  [name] LCSW  02/22/2021

## 2021-02-23 ENCOUNTER — Encounter: Payer: Self-pay | Admitting: Emergency Medicine

## 2021-02-23 ENCOUNTER — Other Ambulatory Visit: Payer: Self-pay

## 2021-02-23 ENCOUNTER — Ambulatory Visit
Admission: EM | Admit: 2021-02-23 | Discharge: 2021-02-23 | Disposition: A | Discharge status: Home / Self Care | Payer: Medicare PPO | Attending: Emergency Medicine | Admitting: Emergency Medicine

## 2021-02-23 DIAGNOSIS — Z8742 Personal history of other diseases of the female genital tract: Secondary | ICD-10-CM | POA: Diagnosis not present

## 2021-02-23 DIAGNOSIS — N898 Other specified noninflammatory disorders of vagina: Secondary | ICD-10-CM | POA: Insufficient documentation

## 2021-02-23 DIAGNOSIS — Z79899 Other long term (current) drug therapy: Secondary | ICD-10-CM | POA: Diagnosis not present

## 2021-02-23 DIAGNOSIS — Z113 Encounter for screening for infections with a predominantly sexual mode of transmission: Secondary | ICD-10-CM | POA: Insufficient documentation

## 2021-02-23 DIAGNOSIS — Z7251 High risk heterosexual behavior: Secondary | ICD-10-CM | POA: Insufficient documentation

## 2021-02-23 DIAGNOSIS — Z87891 Personal history of nicotine dependence: Secondary | ICD-10-CM | POA: Diagnosis not present

## 2021-02-23 LAB — POCT URINALYSIS DIP (MANUAL ENTRY)
Bilirubin, UA: NEGATIVE
Glucose, UA: NEGATIVE mg/dL
Ketones, POC UA: NEGATIVE mg/dL
Leukocytes, UA: NEGATIVE
Nitrite, UA: NEGATIVE
Protein Ur, POC: NEGATIVE mg/dL
Spec Grav, UA: >=1.03 — AB (ref 1.010–1.025)
Urobilinogen, UA: 0.2 E.U./dL
pH, UA: 5.5 (ref 5.0–8.0)

## 2021-02-23 MED ORDER — FLUCONAZOLE 200 MG PO TABS: ORAL_TABLET | ORAL | 0 refills | Status: DC

## 2021-02-23 NOTE — ED Provider Notes (Signed)
[place]   [phone] 02/23/21 Arrival Time: 8182   CC: VAGINAL itching  SUBJECTIVE:  Lindsay Wilkerson is a 75 y.o. female who presents with complaints of vaginal itching and irritation x few months.  Patient reports sexual activity prior to symptoms.  Admits to new sexual partner.  She has tried OTC medications without relief.  She denies aggravating factors  She reports similar symptoms in the past and was diagnosed with yeast infection.  She denies fever, chills, nausea, vomiting, abdominal or pelvic pain, urinary symptoms, vaginal itching, vaginal odor, vaginal bleeding, dyspareunia, vaginal rashes or lesions.   No LMP recorded. Patient is postmenopausal. Current birth control method: Compliant with BC:  ROS: As per HPI.  All other pertinent ROS negative.     Past Medical History:  Diagnosis Date  . Depression, major   . Hypertension   . Urinary bladder incontinence    Past Surgical History:  Procedure Laterality Date  . EYE SURGERY    . IR FALLOPIAN TUBE CATHETERIZATION Left    1980  . TONSILLECTOMY     Age 69   Allergies  Allergen Reactions  . Amoxicillin   . Augmentin [Amoxicillin-Pot Clavulanate] Diarrhea  . Erythromycin   . Sulfa Antibiotics   . Sulfasalazine   . Tetanus Toxoids   . Tussionex Pennkinetic Er [Hydrocod Polst-Cpm Polst Er] Itching   No current facility-administered medications on file prior to encounter.   Current Outpatient Medications on File Prior to Encounter  Medication Sig Dispense Refill  . albuterol (VENTOLIN HFA) 108 (90 Base) MCG/ACT inhaler Inhale into the lungs every 4 (four) hours as needed for wheezing or shortness of breath.    Marland Kitchen atenolol-chlorthalidone (TENORETIC) 50-25 MG per tablet Take 1 tablet by mouth daily.      Marland Kitchen azelastine (ASTELIN) 0.1 % nasal spray Place into both nostrils 2 (two) times daily. Use in each nostril as directed    . beclomethasone (QVAR) 80 MCG/ACT inhaler Inhale 1 puff into the lungs 2 (two)  times daily.    . Bromfenac Sodium (BROMDAY) 0.09 % SOLN Apply 1 drop to eye daily.      Marland Kitchen CLONAZEPAM PO Take 0.5 tablets by mouth daily as needed. FOR ANXIETY     . fluticasone (FLONASE) 50 MCG/ACT nasal spray Place 2 sprays into both nostrils daily.    Marland Kitchen loratadine (CLARITIN) 10 MG tablet Take 10 mg by mouth daily.    . Multiple Vitamins-Minerals (MULTIVITAMIN WITH MINERALS) tablet Take 1 tablet by mouth daily.    . naproxen (NAPROSYN) 500 MG tablet Take 1 tablet (500 mg total) by mouth 2 (two) times daily with a meal. 42 tablet 0  . Omeprazole (PRILOSEC PO) Take 1 capsule by mouth daily. 40 mg    . potassium chloride SA (KLOR-CON M20) 20 MEQ tablet Take 20 mEq by mouth 2 (two) times daily.    . prednisoLONE acetate (PRED MILD) 0.12 % ophthalmic suspension Place 1 drop into both eyes 4 (four) times daily.      . Tolterodine Tartrate (DETROL PO) Take 1 tablet by mouth daily.        Social History   Socioeconomic History  . Marital status: Divorced    Spouse name: Not on file  . Number of children: Not on file  . Years of education: Not on file  . Highest education level: Not on file  Occupational History  . Not on file  Tobacco Use  . Smoking status: Former Research scientist (life sciences)  . Smokeless tobacco: Never  Used  Vaping Use  . Vaping Use: Never used  Substance and Sexual Activity  . Alcohol use: Yes    Comment: occasionally  . Drug use: No  . Sexual activity: Not on file  Other Topics Concern  . Not on file  Social History Narrative  . Not on file   Social Determinants of Health   Financial Resource Strain: Not on file  Food Insecurity: Not on file  Transportation Needs: Not on file  Physical Activity: Not on file  Stress: Not on file  Social Connections: Not on file  Intimate Partner Violence: Not on file   Family History  Problem Relation Age of Onset  . Hypertension Mother   . Heart disease Mother   . Heart disease Father   . Hypertension Sister   . Kidney disease Sister   .  Breast cancer Sister   . Rheum arthritis Sister   . Heart disease Brother   . Hypertension Brother     OBJECTIVE:  Vitals:   02/23/21 1345  BP: (!) 107/58  Pulse: (!) 50  Resp: 19  Temp: 98.4 F (36.9 C)  TempSrc: Oral  SpO2: 93%     General appearance: Alert, NAD, appears stated age Head: NCAT Throat: lips, mucosa, and tongue normal; teeth and gums normal Lungs: CTA bilaterally without adventitious breath sounds Heart: regular rate and rhythm.  Back: no CVA tenderness Abdomen: soft, non-tender; bowel sounds normal; no guarding  GU: deferred Skin: warm and dry Psychological:  Alert and cooperative. Normal mood and affect.  LABS:  Results for orders placed or performed during the hospital encounter of 02/23/21  POCT urinalysis dipstick  Result Value Ref Range   Color, UA yellow yellow   Clarity, UA clear clear   Glucose, UA negative negative mg/dL   Bilirubin, UA negative negative   Ketones, POC UA negative negative mg/dL   Spec Grav, UA >=1.030 (A) 1.010 - 1.025   Blood, UA moderate (A) negative   pH, UA 5.5 5.0 - 8.0   Protein Ur, POC negative negative mg/dL   Urobilinogen, UA 0.2 0.2 or 1.0 E.U./dL   Nitrite, UA Negative Negative   Leukocytes, UA Negative Negative    Labs Reviewed  POCT URINALYSIS DIP (MANUAL ENTRY) - Abnormal; Notable for the following components:      Result Value   Spec Grav, UA >=1.030 (*)    Blood, UA moderate (*)    All other components within normal limits  URINE CULTURE    ASSESSMENT & PLAN:  1. Vaginal itching     Meds ordered this encounter  Medications  . fluconazole (DIFLUCAN) 200 MG tablet    Sig: Take one dose by mouth, wait 72 hours, and then take second dose by mouth    Dispense:  2 tablet    Refill:  0    Order Specific Question:   Supervising Provider    Answer:   Raylene Everts [[id number]]    Pending: Labs Reviewed  POCT URINALYSIS DIP (MANUAL ENTRY) - Abnormal; Notable for the following components:       Result Value   Spec Grav, UA >=1.030 (*)    Blood, UA moderate (*)    All other components within normal limits  URINE CULTURE   Urine without signs of infection Culture sent.  We will follow up with you regarding abnormal results Vaginal self-swab obtained.  We will follow up with you regarding abnormal results HIV/ syphilis testing today Prescribed diflucan 200 mg once daily  and then second dose 72 hours later Take medications as prescribed and to completion If tests results are positive, please abstain from sexual activity until you and your partner(s) have been treated Follow up with PCP for recheck  Return here or go to ER if you have any new or worsening symptoms fever, chills, nausea, vomiting, abdominal or pelvic pain, painful intercourse, vaginal discharge, vaginal bleeding, persistent symptoms despite treatment, etc...  Reviewed expectations re: course of current medical issues. Questions answered. Outlined signs and symptoms indicating need for more acute intervention. Patient verbalized understanding. After Visit Summary given.       Lestine Box, PA-C 02/23/21 1417

## 2021-02-23 NOTE — Discharge Instructions (Signed)
Urine without signs of infection Culture sent.  We will follow up with you regarding abnormal results Vaginal self-swab obtained.  We will follow up with you regarding abnormal results HIV/ syphilis testing today Prescribed diflucan 200 mg once daily and then second dose 72 hours later Take medications as prescribed and to completion If tests results are positive, please abstain from sexual activity until you and your partner(s) have been treated Follow up with PCP for recheck  Return here or go to ER if you have any new or worsening symptoms fever, chills, nausea, vomiting, abdominal or pelvic pain, painful intercourse, vaginal discharge, vaginal bleeding, persistent symptoms despite treatment, etc..Marland Kitchen

## 2021-02-23 NOTE — ED Triage Notes (Addendum)
Had yeast infection in January seen doctor and was given vaginal cream medication that did not work.  Got some otc yeast medications after finishing the rx and still did not work.  Pt had recently completed some abx prior to yeast infections.  Does report some burning with urination.

## 2021-02-24 LAB — RPR: RPR Ser Ql: NONREACTIVE

## 2021-02-24 LAB — HIV ANTIBODY (ROUTINE TESTING W REFLEX): HIV Screen 4th Generation wRfx: NONREACTIVE

## 2021-02-25 LAB — URINE CULTURE
Culture: <10000 — AB
Special Requests: NORMAL

## 2021-02-26 LAB — CERVICOVAGINAL ANCILLARY ONLY
Bacterial Vaginitis (gardnerella): NEGATIVE
Candida Glabrata: NEGATIVE
Candida Vaginitis: NEGATIVE
Chlamydia: NEGATIVE
Comment: NEGATIVE
Comment: NEGATIVE
Comment: NEGATIVE
Comment: NEGATIVE
Comment: NEGATIVE
Comment: NORMAL
Neisseria Gonorrhea: NEGATIVE
Trichomonas: NEGATIVE

## 2021-02-28 ENCOUNTER — Ambulatory Visit (HOSPITAL_COMMUNITY): Payer: Medicare PPO | Admitting: Clinical

## 2021-03-13 ENCOUNTER — Other Ambulatory Visit (HOSPITAL_COMMUNITY)
Admission: RE | Admit: 2021-03-13 | Discharge: 2021-03-13 | Disposition: A | Discharge status: Home / Self Care | Payer: Medicare PPO | Source: Ambulatory Visit | Attending: Internal Medicine | Admitting: Internal Medicine

## 2021-03-13 ENCOUNTER — Other Ambulatory Visit (HOSPITAL_COMMUNITY): Payer: Self-pay | Admitting: Gerontology

## 2021-03-13 ENCOUNTER — Other Ambulatory Visit: Payer: Self-pay

## 2021-03-13 ENCOUNTER — Ambulatory Visit (HOSPITAL_COMMUNITY)
Admission: RE | Admit: 2021-03-13 | Discharge: 2021-03-13 | Disposition: A | Discharge status: Home / Self Care | Payer: Medicare PPO | Source: Ambulatory Visit | Attending: Gerontology | Admitting: Gerontology

## 2021-03-13 DIAGNOSIS — Z79899 Other long term (current) drug therapy: Secondary | ICD-10-CM | POA: Insufficient documentation

## 2021-03-13 DIAGNOSIS — Z0001 Encounter for general adult medical examination with abnormal findings: Secondary | ICD-10-CM | POA: Insufficient documentation

## 2021-03-13 DIAGNOSIS — I10 Essential (primary) hypertension: Secondary | ICD-10-CM | POA: Insufficient documentation

## 2021-03-13 DIAGNOSIS — M25511 Pain in right shoulder: Secondary | ICD-10-CM | POA: Insufficient documentation

## 2021-03-13 LAB — CBC WITH DIFFERENTIAL/PLATELET
Abs Immature Granulocytes: 0.01 10*3/uL (ref 0.00–0.07)
Basophils Absolute: 0 10*3/uL (ref 0.0–0.1)
Basophils Relative: 0 %
Eosinophils Absolute: 0 10*3/uL (ref 0.0–0.5)
Eosinophils Relative: 1 %
HCT: 42.9 % (ref 36.0–46.0)
Hemoglobin: 13.5 g/dL (ref 12.0–15.0)
Immature Granulocytes: 0 %
Lymphocytes Relative: 41 %
Lymphs Abs: 1.9 10*3/uL (ref 0.7–4.0)
MCH: 28.2 pg (ref 26.0–34.0)
MCHC: 31.5 g/dL (ref 30.0–36.0)
MCV: 89.6 fL (ref 80.0–100.0)
Monocytes Absolute: 0.4 10*3/uL (ref 0.1–1.0)
Monocytes Relative: 8 %
Neutro Abs: 2.4 10*3/uL (ref 1.7–7.7)
Neutrophils Relative %: 50 %
Platelets: 255 10*3/uL (ref 150–400)
RBC: 4.79 MIL/uL (ref 3.87–5.11)
RDW: 14.3 % (ref 11.5–15.5)
WBC: 4.7 10*3/uL (ref 4.0–10.5)
nRBC: 0 % (ref 0.0–0.2)

## 2021-03-13 LAB — HEPATIC FUNCTION PANEL
ALT: 14 U/L (ref 0–44)
AST: 14 U/L — ABNORMAL LOW (ref 15–41)
Albumin: 3.7 g/dL (ref 3.5–5.0)
Alkaline Phosphatase: 55 U/L (ref 38–126)
Bilirubin, Direct: 0.1 mg/dL (ref 0.0–0.2)
Indirect Bilirubin: 0.6 mg/dL (ref 0.3–0.9)
Total Bilirubin: 0.7 mg/dL (ref 0.3–1.2)
Total Protein: 6.8 g/dL (ref 6.5–8.1)

## 2021-03-13 LAB — HEMOGLOBIN A1C
Hgb A1c MFr Bld: 6.2 % — ABNORMAL HIGH (ref 4.8–5.6)
Mean Plasma Glucose: 131.24 mg/dL

## 2021-03-13 LAB — BASIC METABOLIC PANEL
Anion gap: 9 (ref 5–15)
BUN: 11 mg/dL (ref 8–23)
CO2: 28 mmol/L (ref 22–32)
Calcium: 9 mg/dL (ref 8.9–10.3)
Chloride: 105 mmol/L (ref 98–111)
Creatinine, Ser: 0.88 mg/dL (ref 0.44–1.00)
GFR, Estimated: >60 mL/min (ref >60)
Glucose, Bld: 99 mg/dL (ref 70–99)
Potassium: 3.6 mmol/L (ref 3.5–5.1)
Sodium: 142 mmol/L (ref 135–145)

## 2021-03-13 LAB — LIPID PANEL
Cholesterol: 131 mg/dL (ref 0–200)
HDL: 37 mg/dL — ABNORMAL LOW (ref >40)
LDL Cholesterol: 76 mg/dL (ref 0–99)
Total CHOL/HDL Ratio: 3.5 RATIO
Triglycerides: 88 mg/dL (ref <150)
VLDL: 18 mg/dL (ref 0–40)

## 2021-03-19 ENCOUNTER — Other Ambulatory Visit: Payer: Self-pay

## 2021-03-19 ENCOUNTER — Ambulatory Visit (INDEPENDENT_AMBULATORY_CARE_PROVIDER_SITE_OTHER): Payer: Medicare PPO | Admitting: Clinical

## 2021-03-19 DIAGNOSIS — F331 Major depressive disorder, recurrent, moderate: Secondary | ICD-10-CM

## 2021-03-19 DIAGNOSIS — F419 Anxiety disorder, unspecified: Secondary | ICD-10-CM

## 2021-03-19 NOTE — Progress Notes (Signed)
Virtual Visit via Telephone Note  I connected withOllie Stowers on 03/19/21 at  10:00 AM EST by telephoneand verified that I am speaking with the correct person using two identifiers.  Location: Patient: Home Provider: Office  I discussed the limitations, risks, security and privacy concerns of performing an evaluation and management service by telephone and the availability of in person appointments. I also discussed with the patient that there may be a patient responsible charge related to this service. The patient expressed understanding and agreed to proceed.  THERAPIST PROGRESS NOTE  Session Time:10:00AM-10:55AM  Participation Level:Active  Behavioral Response:CasualAlertDepressed  Type of Therapy:Individual Therapy  Treatment Goals addressed:Coping  Interventions:Motivational Interviewing, Solution Focused, Strength-based, Supportive and Anger Management Training  Summary:[place] Gatherightis [phone].o.femalewho presents withDepression and Anxiety.The OPT therapist worked with thepatientfor herongoingOPT treatment. The OPT therapist utilized Motivational Interviewing to assist in creating therapeutic repore. The patient in the session was engaged and work in Science writer about hertriggers and symptoms over the past few weeksincludingherdifficulty and stress aroundongoing involvement with a female friend who she enjoys the company of but is not on the same page as the patient who is seeking a more traditional monogamous relationship.The patient is continues seeking a healthier involvement and relationship with someone new, however, admits she has allowed access to the guy she has been seeing while knowing it will likely lead to her being upset due to the guys not wanting to be in a committed relationship.The OPT therapist worked reviewingcoping strategies to assist in management ofDepression/Anxiety.The OPT therapist worked with the  patient to examine herutilization ofsupport systemand promoted leisure/self care. The OPT therapist worked with the patient on making sure she aligns her needs with someone who is looking for a more tradition relationship.The OPT therapist provided psycho-education and support throughout the treatment session.  Suicidal/Homicidal:Nowithout intent/plan  Therapist Response:. TheOPT therapist gained feedback through utilizing Bailey into the patientstriggers, symptoms, and behavior responses over the pastfewweeks.The OPT therapist was able to getfeedbackabout the patientsdifficultywith feeling her socialization need outside getting this met by the guy she has been involved with previously who she is working still to move forward from.The OPT therapist worked with the patient using Cognitive Behavioral Therapy exercise within the session.The OPT therapist continued to work with the patient on positive thinking.The patient verbalized her understanding of the risk of not filling her socialization needs in a friend or relationship with someone who better aligns with what the patient is looking for in a friendship/relationship.The OPT therapist will continue treatment work with the patient in hernext scheduled session.   Plan: Return again in2/3weeks.  Diagnosis:Axis I:Recurrent moderate major depressive disorder with anxiety  Axis II:No diagnosis  I discussed the assessment and treatment plan with the patient. The patient was provided an opportunity to ask questions and all were answered. The patient agreed with the plan and demonstrated an understanding of the instructions.  The patient was advised to call back or seek an in-person evaluation if the symptoms worsen or if the condition fails to improve as anticipated.  I provided69mnutes of non-face-to-face time during this encounter.  [name] LCSW

## 2021-04-09 ENCOUNTER — Ambulatory Visit (INDEPENDENT_AMBULATORY_CARE_PROVIDER_SITE_OTHER): Payer: Medicare PPO | Admitting: Clinical

## 2021-04-09 ENCOUNTER — Other Ambulatory Visit: Payer: Self-pay

## 2021-04-09 DIAGNOSIS — F331 Major depressive disorder, recurrent, moderate: Secondary | ICD-10-CM

## 2021-04-09 DIAGNOSIS — F419 Anxiety disorder, unspecified: Secondary | ICD-10-CM

## 2021-04-09 NOTE — Progress Notes (Signed)
Virtual Visit via Telephone Note  I connected withOllie Wilkerson 04/18/22at 3:00 PM ESTby telephoneand verified that I am speaking with the correct person using two identifiers.  Location: Patient:Home Provider:Office  I discussed the limitations, risks, security and privacy concerns of performing an evaluation and management service by telephone and the availability of in person appointments. I also discussed with the patient that there may be a patient responsible charge related to this service. The patient expressed understanding and agreed to proceed.  THERAPIST PROGRESS NOTE  Session Time:3:10PM-3:55PM  Participation Level:Active  Behavioral Response:CasualAlertDepressed  Type of Therapy:Individual Therapy  Treatment Goals addressed:Coping  Interventions:Motivational Interviewing, Solution Focused, Strength-based, Supportive and Anger Management Training  Summary:Lindsay Wilkerson [phone].o.femalewho presents withDepression and Anxiety.The OPT therapist worked with thepatientfor herongoingOPT treatment. The OPT therapist utilized Motivational Interviewing to assist in creating therapeutic repore. The patient in the session was engaged and work in Science writer about hertriggers and symptoms over the past few weeksincludingherdifficulty and stress aroundongoing involvement with a female friend who she enjoys the company of but is not on the same page as the patient who is seeking a more traditional monogamous relationship.The patient has been working to be more involved and connected socially and has been volunteering and making social connections.The OPT therapist worked reviewingcoping strategies to assist in management ofDepression/Anxiety.The OPT therapist worked with the patient to examine herutilization ofsupport systemand promoted leisure/self care. The OPT therapist continued to work with the patient onmaking  sure she aligns her needs with someone who is looking for a more tradition relationship.The OPT therapist provided psycho-education and support throughout the treatment session.  Suicidal/Homicidal:Nowithout intent/plan  Therapist Response:. TheOPT therapist gained feedback through utilizing Bern into the patientstriggers, symptoms, and behavior responses over the pastfewweeks.The OPT therapist was able to getfeedbackabout the patientsdifficultywithfeeling her socialization need outside getting this met by the guy she has been involved with previously who she is working still to move forward from.The OPT therapist worked with the patient using Cognitive Behavioral Therapy exercise within the session.The OPT therapist continued to work with the patient on positive thinking.Thepatientverbalized her understanding of the risk of not filling her socialization needs in a friend or relationship with someone who better aligns with what the patient is looking for in a friendship/relationship.The OPT therapist will continue treatment work with the patient in hernext scheduled session.   Plan: Return again in2/3weeks.  Diagnosis:Axis I:Recurrent moderate major depressive disorder with anxiety  Axis II:No diagnosis  I discussed the assessment and treatment plan with the patient. The patient was provided an opportunity to ask questions and all were answered. The patient agreed with the plan and demonstrated an understanding of the instructions.  The patient was advised to call back or seek an in-person evaluation if the symptoms worsen or if the condition fails to improve as anticipated.  I provided46mnutes of non-face-to-face time during this encounter.  [name] LCSW  04/13/2021

## 2021-05-22 ENCOUNTER — Encounter: Payer: Self-pay | Admitting: Orthopaedic Surgery

## 2021-05-22 ENCOUNTER — Ambulatory Visit: Payer: Medicare PPO

## 2021-05-22 ENCOUNTER — Ambulatory Visit: Payer: Medicare PPO | Admitting: Orthopaedic Surgery

## 2021-05-22 ENCOUNTER — Other Ambulatory Visit: Payer: Self-pay

## 2021-05-22 VITALS — BP 121/74 | HR 61 | Ht 68.0 in | Wt 211.2 lb

## 2021-05-22 DIAGNOSIS — M25561 Pain in right knee: Secondary | ICD-10-CM | POA: Diagnosis not present

## 2021-05-22 DIAGNOSIS — M542 Cervicalgia: Secondary | ICD-10-CM

## 2021-05-22 DIAGNOSIS — G8929 Other chronic pain: Secondary | ICD-10-CM

## 2021-05-22 NOTE — Progress Notes (Signed)
Subjective:    Patient ID: Lindsay Wilkerson, female    DOB: 1946-01-05, 75 y.o.   MRN: [id number]  HPI She has right knee pain with swelling, popping and giving way for many months that is getting worse. She has no trauma, no redness, no numbness.  She has tried ice, Tylenol and rubs with no help.  She also has neck pain, upper right trapezius pain and right shoulder pain with paresthesias to the lower arm but not to the fingers. She has no trauma, no swelling, no redness. She has tried ice, tylenol with no help.  She has pain at the end of the day.  She has no shoulder pain on the left.   Review of Systems  Constitutional: Positive for activity change.  Musculoskeletal: Positive for arthralgias, gait problem, joint swelling, myalgias and neck pain.  All other systems reviewed and are negative.  For Review of Systems, all other systems reviewed and are negative.  The following is a summary of the past history medically, past history surgically, known current medicines, social history and family history.  This information is gathered electronically by the computer from prior information and documentation.  I review this each visit and have found including this information at this point in the chart is beneficial and informative.   Past Medical History:  Diagnosis Date  . Depression, major   . Hypertension   . Urinary bladder incontinence     Past Surgical History:  Procedure Laterality Date  . EYE SURGERY    . IR FALLOPIAN TUBE CATHETERIZATION Left    1980  . TONSILLECTOMY     Age 49    Current Outpatient Medications on File Prior to Visit  Medication Sig Dispense Refill  . albuterol (VENTOLIN HFA) 108 (90 Base) MCG/ACT inhaler Inhale into the lungs every 4 (four) hours as needed for wheezing or shortness of breath.    Marland Kitchen atenolol-chlorthalidone (TENORETIC) 50-25 MG per tablet Take 1 tablet by mouth daily.      Marland Kitchen azelastine (ASTELIN) 0.1 % nasal spray Place into both nostrils 2  (two) times daily. Use in each nostril as directed    . beclomethasone (QVAR) 80 MCG/ACT inhaler Inhale 1 puff into the lungs 2 (two) times daily.    . Bromfenac Sodium 0.09 % SOLN Apply 1 drop to eye daily.   (Patient not taking: Reported on 05/22/2021)    . CLONAZEPAM PO Take 0.5 tablets by mouth daily as needed. FOR ANXIETY     . fluconazole (DIFLUCAN) 200 MG tablet Take one dose by mouth, wait 72 hours, and then take second dose by mouth 2 tablet 0  . fluticasone (FLONASE) 50 MCG/ACT nasal spray Place 2 sprays into both nostrils daily.    Marland Kitchen loratadine (CLARITIN) 10 MG tablet Take 10 mg by mouth daily.    . Multiple Vitamins-Minerals (MULTIVITAMIN WITH MINERALS) tablet Take 1 tablet by mouth daily.    . naproxen (NAPROSYN) 500 MG tablet Take 1 tablet (500 mg total) by mouth 2 (two) times daily with a meal. (Patient not taking: Reported on 05/22/2021) 42 tablet 0  . Omeprazole (PRILOSEC PO) Take 1 capsule by mouth daily. 40 mg    . potassium chloride SA (KLOR-CON M20) 20 MEQ tablet Take 20 mEq by mouth 2 (two) times daily.    . prednisoLONE acetate (PRED MILD) 0.12 % ophthalmic suspension Place 1 drop into both eyes 4 (four) times daily.   (Patient not taking: Reported on 05/22/2021)    . Tolterodine  Tartrate (DETROL PO) Take 1 tablet by mouth daily.       No current facility-administered medications on file prior to visit.    Social History   Socioeconomic History  . Marital status: Divorced    Spouse name: Not on file  . Number of children: Not on file  . Years of education: Not on file  . Highest education level: Not on file  Occupational History  . Not on file  Tobacco Use  . Smoking status: Former Research scientist (life sciences)  . Smokeless tobacco: Never Used  Vaping Use  . Vaping Use: Never used  Substance and Sexual Activity  . Alcohol use: Yes    Comment: occasionally  . Drug use: No  . Sexual activity: Not on file  Other Topics Concern  . Not on file  Social History Narrative  . Not on file    Social Determinants of Health   Financial Resource Strain: Not on file  Food Insecurity: Not on file  Transportation Needs: Not on file  Physical Activity: Not on file  Stress: Not on file  Social Connections: Not on file  Intimate Partner Violence: Not on file    Family History  Problem Relation Age of Onset  . Hypertension Mother   . Heart disease Mother   . Heart disease Father   . Hypertension Sister   . Kidney disease Sister   . Breast cancer Sister   . Rheum arthritis Sister   . Heart disease Brother   . Hypertension Brother     BP 121/74   Pulse 61   Ht 5\' 8"  (1.727 m)   Wt 211 lb 4 oz (95.8 kg)   BMI 32.12 kg/m   Body mass index is 32.12 kg/m.      Objective:   Physical Exam Vitals and nursing note reviewed. Exam conducted with a chaperone present.  Constitutional:      Appearance: She is well-developed.  HENT:     Head: Normocephalic and atraumatic.  Eyes:     Conjunctiva/sclera: Conjunctivae normal.     Pupils: Pupils are equal, round, and reactive to light.  Cardiovascular:     Rate and Rhythm: Normal rate and regular rhythm.  Pulmonary:     Effort: Pulmonary effort is normal.  Abdominal:     Palpations: Abdomen is soft.  Musculoskeletal:       Arms:     Cervical back: Normal range of motion and neck supple.       Legs:  Skin:    General: Skin is warm and dry.  Neurological:     Mental Status: She is alert and oriented to person, place, and time.     Cranial Nerves: No cranial nerve deficit.     Motor: No abnormal muscle tone.     Coordination: Coordination normal.     Deep Tendon Reflexes: Reflexes are normal and symmetric. Reflexes normal.  Psychiatric:        Behavior: Behavior normal.        Thought Content: Thought content normal.        Judgment: Judgment normal.    X-rays were done of the right knee and cervical spine, reported separately.  I have shown her the X-rays and printed paper copies.       Assessment &  Plan:   Encounter Diagnoses  Name Primary?  . Chronic pain of right knee Yes  . Chronic neck pain    PROCEDURE NOTE:  The patient requests injections of the right knee ,  verbal consent was obtained.  The right knee was prepped appropriately after time out was performed.   Sterile technique was observed and injection of 1 cc of DepoMedrol 40mg  with several cc's of plain xylocaine. Anesthesia was provided by ethyl chloride and a 20-gauge needle was used to inject the knee area. The injection was tolerated well.  A band aid dressing was applied.  The patient was advised to apply ice later today and tomorrow to the injection sight as needed.  I will have her begin Aleve one bid pc and then increase to two BID pc if needed.  Return in three weeks.  Consider MRI if not improved.  Call if any problem.  Precautions discussed.   Electronically Signed Sanjuana Kava, MD 5/31/202211:57 AM

## 2021-05-22 NOTE — Patient Instructions (Signed)
Begin Aleve one twice a day.  You may increase to two twice a day after eating if needed.

## 2021-05-24 ENCOUNTER — Telehealth: Payer: Self-pay | Admitting: Orthopaedic Surgery

## 2021-05-24 NOTE — Telephone Encounter (Signed)
Patient called she has a question about her medication   Please call her back at [phone]

## 2021-05-24 NOTE — Telephone Encounter (Signed)
Spoke with patient about her concern of her medication. She remembers having some sort of reaction to Naprosyn and wanted to know if she could take Tylenol Arthritis instead. I spoke with Dr. Luna Glasgow and he said that she could stop Aleve and take Tylenol Arthritis twice a day with food.

## 2021-06-12 ENCOUNTER — Other Ambulatory Visit: Payer: Self-pay

## 2021-06-12 ENCOUNTER — Ambulatory Visit: Payer: Medicare PPO | Admitting: Orthopaedic Surgery

## 2021-06-12 ENCOUNTER — Encounter: Payer: Self-pay | Admitting: Orthopaedic Surgery

## 2021-06-12 VITALS — BP 138/70 | HR 54 | Ht 68.0 in | Wt 211.5 lb

## 2021-06-12 DIAGNOSIS — M5410 Radiculopathy, site unspecified: Secondary | ICD-10-CM

## 2021-06-12 DIAGNOSIS — M542 Cervicalgia: Secondary | ICD-10-CM | POA: Diagnosis not present

## 2021-06-12 DIAGNOSIS — M25561 Pain in right knee: Secondary | ICD-10-CM

## 2021-06-12 DIAGNOSIS — G8929 Other chronic pain: Secondary | ICD-10-CM

## 2021-06-12 NOTE — Progress Notes (Signed)
My knee is better, my neck and arm are not.  She has much less pain of the knee on the right after the injection. Her swelling is less.  She has no new trauma.  Her neck is still hurting and she has paresthesias to the right hand, index and long with pain in the right upper arm.  She is worse.  She has no new trauma.  She is taking Tylenol.  She cannot take NSAIDs.  I am concerned about HNP in the neck.  I would like to get MRI.  Encounter Diagnoses  Name Primary?   Radicular pain of upper extremity Yes   Chronic pain of right knee    Chronic neck pain    Schedule MRI of the neck.  Return in three weeks.  Call if any problem.  Precautions discussed.  Electronically Signed Sanjuana Kava, MD 6/21/20228:52 AM

## 2021-06-12 NOTE — Patient Instructions (Signed)
While we are working on your approval for MRI please go ahead and call to schedule your appointment with[facility] within at least one (1) week.   [facility] [phone]

## 2021-06-14 ENCOUNTER — Telehealth: Payer: Self-pay | Admitting: Orthopaedic Surgery

## 2021-06-14 NOTE — Telephone Encounter (Signed)
Patient called and states she was told to call and schedule her MRI.  She has tried several times to get it scheduled but no one is answering the phone.  She states there are 2 options   1. To leave a message and they will call you back  2.  Hang up and try again later    She left a voicemail for them to call her back and no one has.    She also has another question about the MRI.    Please Call her back at [phone]

## 2021-06-14 NOTE — Telephone Encounter (Signed)
I had to leave a message for the patient. She is going to [facility] for MRI. I left their phone number [phone] option 1  to make her an appointment. She will have to stay on the line until someone answers, because this is the scheduling department. Told her to call me if she needed to.

## 2021-06-21 ENCOUNTER — Telehealth: Payer: Self-pay

## 2021-06-21 NOTE — Telephone Encounter (Signed)
Received fax from [facility] stating that the patient couldn't complete the MRI exam due to Claustrophobic. A comment was also noted on the faxed that patient would be calling for medication.  I called patient and left message for her to call the office. I wanted to see if she was going to reschedule the appointment or not.

## 2021-06-26 ENCOUNTER — Telehealth: Payer: Self-pay | Admitting: Orthopaedic Surgery

## 2021-06-26 NOTE — Telephone Encounter (Signed)
Patient called and left voicemail stating Nurse Inez Catalina called her and left a message for her to call the office and she was going on vacation and for her to ask for another nurse to talk with.  Please call her back at [phone].

## 2021-06-27 NOTE — Telephone Encounter (Signed)
There is a note in system, asking if patient still wants MRI she went to [facility] but couldn't do it. I called but no one answered. If she calls back please ask her about MRI

## 2021-06-29 NOTE — Telephone Encounter (Signed)
Patient did call regarding being unable to have the MRI as scheduled , and the MRI referral in workqueue has been updated. Patient requests MRI to be done at [facility]; states she thinks she can "handle" an MRI there, or even at [facility]. She asked if her anti-anxiety medication might be adequate to help her to get through, or if Dr Luna Glasgow may need to prescribe another medication.  If so, pharmacy is [facility], [place].

## 2021-06-29 NOTE — Telephone Encounter (Signed)
As of telephone notes through 06/29/21 - Please refer to other telephone notes which include the updated information. Closing this note.

## 2021-07-03 ENCOUNTER — Ambulatory Visit: Payer: Medicare PPO | Admitting: Orthopaedic Surgery

## 2021-07-04 ENCOUNTER — Telehealth: Payer: Self-pay

## 2021-07-04 NOTE — Telephone Encounter (Signed)
Patient called back with a question about her anxiety medication. She takes Clonazepam po 0.5 mg and wanted to know if it would be ok for her to take 2 of these an hour before she has her MRI.   Please advise.

## 2021-07-17 ENCOUNTER — Ambulatory Visit (HOSPITAL_COMMUNITY)
Admission: RE | Admit: 2021-07-17 | Discharge: 2021-07-17 | Disposition: A | Discharge status: Home / Self Care | Payer: Medicare PPO | Source: Ambulatory Visit | Attending: Orthopaedic Surgery | Admitting: Orthopaedic Surgery

## 2021-07-17 ENCOUNTER — Other Ambulatory Visit: Payer: Self-pay

## 2021-07-17 DIAGNOSIS — M5410 Radiculopathy, site unspecified: Secondary | ICD-10-CM | POA: Diagnosis not present

## 2021-07-17 DIAGNOSIS — G8929 Other chronic pain: Secondary | ICD-10-CM | POA: Diagnosis present

## 2021-07-17 DIAGNOSIS — M542 Cervicalgia: Secondary | ICD-10-CM | POA: Insufficient documentation

## 2021-07-17 NOTE — Telephone Encounter (Signed)
Done - spoke with patient. Understands per Dr Brooke Bonito response.

## 2021-07-19 ENCOUNTER — Encounter: Payer: Self-pay | Admitting: Orthopaedic Surgery

## 2021-07-19 ENCOUNTER — Ambulatory Visit: Payer: Medicare PPO | Admitting: Orthopaedic Surgery

## 2021-07-19 ENCOUNTER — Other Ambulatory Visit: Payer: Self-pay

## 2021-07-19 VITALS — BP 116/71 | HR 70 | Ht 68.0 in | Wt 211.0 lb

## 2021-07-19 DIAGNOSIS — G8929 Other chronic pain: Secondary | ICD-10-CM | POA: Diagnosis not present

## 2021-07-19 DIAGNOSIS — M542 Cervicalgia: Secondary | ICD-10-CM | POA: Diagnosis not present

## 2021-07-19 NOTE — Addendum Note (Signed)
Addended by: Derek Mound A on: 07/19/2021 11:34 AM   Modules accepted: Orders

## 2021-07-19 NOTE — Progress Notes (Signed)
My neck still hurts.  She had MRI of the cervical spine showing:  IMPRESSION: Right much worse than left uncovertebral disease at C4-5 causes severe right and moderate left foraminal narrowing. The ventral thecal sac is narrowed but not effaced at C4-5.   Uncovertebral disease causes right worse than left moderate to moderately severe foraminal narrowing at C5-6. The central canal is open at C5-6.   Disc bulge at C3-4 narrows the ventral thecal sac. There is mild bilateral foraminal narrowing at C3-4.  I have explained the findings to her.  I will have her see neurosurgery.  I have independently reviewed the MRI.      I will begin PT.  Her neck is tender but she has good ROM.  Muscle tone and strength is good upper extremities.  NV intact.  Encounter Diagnosis  Name Primary?   Chronic neck pain Yes   See neurosurgeon.  Go to PT.  Return in five weeks.  Call if any problem.  Precautions discussed.  Electronically Signed Sanjuana Kava, MD 7/28/20229:46 AM

## 2021-08-06 ENCOUNTER — Other Ambulatory Visit: Payer: Self-pay

## 2021-08-06 ENCOUNTER — Ambulatory Visit (INDEPENDENT_AMBULATORY_CARE_PROVIDER_SITE_OTHER): Payer: Medicare PPO

## 2021-08-06 ENCOUNTER — Ambulatory Visit: Payer: Medicare PPO | Admitting: Podiatry

## 2021-08-06 DIAGNOSIS — M722 Plantar fascial fibromatosis: Secondary | ICD-10-CM

## 2021-08-06 NOTE — Patient Instructions (Signed)

## 2021-08-07 ENCOUNTER — Ambulatory Visit (INDEPENDENT_AMBULATORY_CARE_PROVIDER_SITE_OTHER): Payer: Medicare PPO | Admitting: Obstetrics & Gynecology

## 2021-08-07 ENCOUNTER — Encounter: Payer: Self-pay | Admitting: Obstetrics & Gynecology

## 2021-08-07 VITALS — BP 117/56 | HR 59 | Ht 68.0 in | Wt 210.6 lb

## 2021-08-07 DIAGNOSIS — L309 Dermatitis, unspecified: Secondary | ICD-10-CM | POA: Diagnosis not present

## 2021-08-07 NOTE — Progress Notes (Signed)
    Patient name: Lindsay Wilkerson MRN [id number]  Date of birth: [birth year] Chief Complaint:   Gynecologic Exam  History of Present Illness:   Lindsay Wilkerson is a 75 y.o. G3P0030 PM female being seen today for the following concerns:  Itching/rash on her thighs that has been there for the past week.  Symptoms about the same- applied coconut oil and vaseline and did note some improvement.  Denies vaginal discharge or bleeding.  Denies vaginal odor.  No LMP recorded. Patient is postmenopausal.  Last pap 2019.  Last mammogram: 2022. Last colonoscopy: 2018- pt advised q 47yr- followed by [facility]  Depression screen [employer]2/9 08/07/2021  Decreased Interest 0  Down, Depressed, Hopeless 0  PHQ - 2 Score 0  Altered sleeping 1  Tired, decreased energy 2  Change in appetite 1  Feeling bad or failure about yourself  1  Trouble concentrating 1  Moving slowly or fidgety/restless 0  Suicidal thoughts 0  PHQ-9 Score 6      Review of Systems:   Pertinent items are noted in HPI Denies any headaches, blurred vision, fatigue, shortness of breath, chest pain, abdominal pain, bowel movements, urination, or intercourse unless otherwise stated above.  Pertinent History Reviewed:  Reviewed past medical,surgical, social and family history.  Reviewed problem list, medications and allergies. Physical Assessment:   Vitals:   08/07/21 1435  BP: (!) 117/56  Pulse: (!) 59  Weight: 210 lb 9.6 oz (95.5 kg)  Height: '5\' 8"'$  (1.727 m)  Body mass index is 32.02 kg/m.        Physical Examination:   General appearance - well appearing, and in no distress  Mental status - alert, oriented to person, place, and time  Psych:  She has a normal mood and affect  Skin - warm and dry, normal color, no suspicious lesions noted  Chest - effort normal, all lung fields clear to auscultation bilaterally  Heart - normal rate and regular rhythm  Neck:  midline trachea, no thyromegaly or nodules  Breasts - breasts  appear normal, no suspicious masses, no skin or nipple changes or  axillary nodes  Abdomen - soft, nontender, nondistended, no masses or organomegaly  Pelvic - VULVA: slight hyperpigmentation noted bilateral labial majora/inner thigh, no lesions noted, no tenderness,  VAGINA: normal appearing vagina with normal color and discharge, no lesions  CERVIX: normal appearing cervix without discharge or lesions, no CMT  Pap not indicated  UTERUS: uterus is felt to be normal size, shape, consistency and nontender   ADNEXA: No adnexal masses or tenderness noted.  Extremities:  No swelling or varicosities noted  Chaperone:  Dr. PArby Barrette     Assessment & Plan:  1) Vulvar irritation -reviewed conservative therapy -possible atrophic component -Discussed vaginal estrogen therapy and reviewed WHI study -she would prefer to avoid any estrogen if possible.  For now trial of conservative therapy, if no improvement or worsening of symptoms RTC  2) Preventive screening Reviewed screening guidelines, pap no longer indicated Up to date on other preventive measures   Meds: No orders of the defined types were placed in this encounter.   Follow-up: Return in about 2 years (around 08/08/2023) for Annual.   JJanyth Pupa DO Attending [place] [facility] [place] [place]

## 2021-08-07 NOTE — Progress Notes (Signed)
  Subjective:  Patient ID: Lindsay Wilkerson, female    DOB: 1946-12-01,  MRN: [id number]  Chief Complaint  Patient presents with   Foot Pain      (np) bilat foot pain     75 y.o. female presents with the above complaint. History confirmed with patient.  She has had bilateral heel and arch pain for the last 2 to 4 weeks.  Hurts when she gets up in the morning.  The right is worse.  Objective:  Physical Exam: warm, good capillary refill, no trophic changes or ulcerative lesions, normal DP and PT pulses, and normal sensory exam. Left Foot: point tenderness over the heel pad and point tenderness of the mid plantar fascia Right Foot: point tenderness over the heel pad and point tenderness of the mid plantar fascia  No images are attached to the encounter.  Radiographs: Multiple views x-ray of both feet: no fracture, dislocation, swelling or degenerative changes noted and plantar calcaneal spur larger on the right side Assessment:   1. Plantar fasciitis, bilateral      Plan:  Patient was evaluated and treated and all questions answered.  Discussed the etiology and treatment options for plantar fasciitis including stretching, formal physical therapy, supportive shoegears such as a running shoe or sneaker, pre fabricated orthoses, injection therapy, and oral medications. We also discussed the role of surgical treatment of this for patients who do not improve after exhausting non-surgical treatment options.   -XR reviewed with patient -Educated patient on stretching and icing of the affected limb -Plantar fascial brace dispensed to use bilaterally -Injection delivered to the plantar fascia of the right foot.  Did not inject the left foot today we will do this next time if still painful  After sterile prep with povidone-iodine solution and alcohol, the right heel was injected with 0.5cc 2% xylocaine plain, 0.5cc 0.5% marcaine plain, '5mg'$  triamcinolone acetonide, and '2mg'$  dexamethasone was  injected along the medial plantar fascia at the insertion on the plantar calcaneus. The patient tolerated the procedure well without complication.   Return in about 6 weeks (around 09/17/2021) for recheck plantar fasciitis.

## 2021-08-07 NOTE — Patient Instructions (Signed)
Ok to trial coconut oil and vaseline. Consider "googling" herbal supplements for vaginal dryness: Evening Primose Black cohose ?soy products  Do not insert IN the vagina.  No Yoni Pearls.

## 2021-08-08 ENCOUNTER — Other Ambulatory Visit: Payer: Self-pay

## 2021-08-08 ENCOUNTER — Ambulatory Visit (HOSPITAL_COMMUNITY): Payer: Medicare PPO | Attending: Orthopaedic Surgery | Admitting: Physical Therapy

## 2021-08-08 ENCOUNTER — Encounter (HOSPITAL_COMMUNITY): Payer: Self-pay | Admitting: Physical Therapy

## 2021-08-08 DIAGNOSIS — M542 Cervicalgia: Secondary | ICD-10-CM | POA: Diagnosis present

## 2021-08-08 DIAGNOSIS — R29898 Other symptoms and signs involving the musculoskeletal system: Secondary | ICD-10-CM | POA: Insufficient documentation

## 2021-08-08 NOTE — Patient Instructions (Signed)
Access Code: [email or web address] URL: [email or web address] Date: 08/08/2021 Prepared by: Mitzi Hansen Veleta Yamamoto  Exercises Supine Chin Tuck - 3-5 x daily - 7 x weekly - 2 sets - 10 reps

## 2021-08-08 NOTE — Therapy (Signed)
[place], [place], [address] Phone: [phone]   Fax:  [phone]  Physical Therapy Evaluation  Patient Details  Name: Lindsay Wilkerson MRN: [id number] Date of Birth: April 07, 1946 Referring Provider (PT): Sanjuana Kava MD   Encounter Date: 08/08/2021   PT End of Session - 08/08/21 1147     Visit Number 1    Number of Visits 12    Date for PT Re-Evaluation 09/19/21    Authorization Type [employer] (auth required)    Authorization Time Period 12 visits requested 8/17-9/28    Authorization - Visit Number 1    Authorization - Number of Visits 12    Progress Note Due on Visit 10    PT Start Time 1050    PT Stop Time 1145    PT Time Calculation (min) 55 min    Activity Tolerance Patient tolerated treatment well    Behavior During Therapy [facility] for tasks assessed/performed             Past Medical History:  Diagnosis Date   Depression, major    Hypertension    Urinary bladder incontinence     Past Surgical History:  Procedure Laterality Date   EYE SURGERY     IR FALLOPIAN TUBE CATHETERIZATION Left    1980   TONSILLECTOMY     Age 43    There were no vitals filed for this visit.    Subjective Assessment - 08/08/21 1052     Subjective Patient is a 75 y.o. female who presents to physical therapy with c/o chronic neck pain. Patient states symptoms began about a year ago with insidious onset. She got a cortisone about 20 years ago and didn't have any more problems after that. She has pinched nerves and bone spurs. Symptoms aggravated with lifting, sleeping on pillows that are too high. She points to symptoms in R UT. Patient has tried ice, heat, meds, and creams with minimal relief. Patient states her main goal is to get rid of the pain.    Limitations House hold activities    Patient Stated Goals decrease pain    Currently in Pain? Yes    Pain Score 4     Pain Location Neck    Pain Orientation  Right    Pain Descriptors / Indicators Dull    Pain Type Chronic pain    Pain Onset More than a month ago    Pain Frequency Constant                [facility] PT Assessment - 08/08/21 0001       Assessment   Medical Diagnosis Chronic Neck pain    Referring Provider (PT) Sanjuana Kava MD    Onset Date/Surgical Date 08/08/20    Next MD Visit September    Prior Therapy elbow      Precautions   Precautions None      Restrictions   Weight Bearing Restrictions No      Balance Screen   Has the patient fallen in the past 6 months No    Has the patient had a decrease in activity level because of a fear of falling?  No    Is the patient reluctant to leave their home because of a fear of falling?  No      Prior Function   Level of Independence Independent      Cognition   Overall Cognitive Status Within Functional Limits for tasks assessed  Observation/Other Assessments   Observations slouched in seated, ambulates without AD    Focus on Therapeutic Outcomes (FOTO)  59% function      Sensation   Light Touch Appears Intact   increased R C3     Posture/Postural Control   Posture/Postural Control Postural limitations    Postural Limitations Rounded Shoulders;Forward head;Increased thoracic kyphosis      ROM / Strength   AROM / PROM / Strength AROM;Strength      AROM   Overall AROM Comments R UT symptoms with all testing    AROM Assessment Site Cervical    Cervical Flexion 0% limited    Cervical Extension 25% limited    Cervical - Right Side Bend 50% limited    Cervical - Left Side Bend 50% limited    Cervical - Right Rotation 50% limited    Cervical - Left Rotation 25% limited      Strength   Strength Assessment Site Shoulder;Elbow;Wrist;Hand    Right/Left Shoulder Right;Left    Right Shoulder ABduction 4+/5   shoulder pain   Left Shoulder ABduction 5/5    Right/Left Elbow Right;Left    Right Elbow Flexion 5/5    Right Elbow Extension 5/5    Left Elbow Flexion  5/5    Left Elbow Extension 5/5    Right/Left Wrist Right;Left    Right Wrist Flexion 5/5    Right Wrist Extension 5/5    Left Wrist Flexion 5/5    Left Wrist Extension 5/5    Right/Left hand Right;Left    Right Hand Gross Grasp Functional    Left Hand Gross Grasp Functional      Palpation   Spinal mobility hypomobile cervical spine with greatest tenderness R side    Palpation comment minimally TTP cervical paraspinals,                        Objective measurements completed on examination: See above findings.       [facility] Adult PT Treatment/Exercise - 08/08/21 0001       Exercises   Exercises Neck      Neck Exercises: Supine   Neck Retraction 10 reps;3 secs                    PT Education - 08/08/21 1050     Education Details Patient educated on exam findings, POC, scope of PT, posture, HEP, what "pinched nerve" could be    Person(s) Educated Patient    Methods Explanation;Handout    Comprehension Verbalized understanding              PT Short Term Goals - 08/08/21 1155       PT SHORT TERM GOAL #1   Title Patient will be independent with HEP in order to improve functional outcomes.    Time 3    Period Weeks    Status New    Target Date 08/29/21      PT SHORT TERM GOAL #2   Title Patient will report at least 25% improvement in symptoms for improved quality of life.    Time 3    Period Weeks    Status New    Target Date 08/29/21               PT Long Term Goals - 08/08/21 1156       PT LONG TERM GOAL #1   Title Patient will report at least 75% improvement in symptoms for improved  quality of life.    Time 6    Period Weeks    Status New    Target Date 09/19/21      PT LONG TERM GOAL #2   Title Patient will improve FOTO score by at least 10 points in order to indicate improved tolerance to activity.    Time 6    Period Weeks    Status New    Target Date 09/19/21      PT LONG TERM GOAL #3   Title Patient will  demonstrate at least 25% improvement in cervical ROM in all restricted planes for improved ability to move head while at home.    Time 6    Period Weeks    Status New    Target Date 09/19/21                    Plan - 08/08/21 1150     Clinical Impression Statement Patient is a 75 y.o. female who presents to physical therapy with c/o chronic neck pain. She presents with pain limited deficits in Cervical spine muscle strength, ROM, endurance, postural impairments, spinal mobility and functional mobility with ADL. She is having to modify and restrict ADL as indicated by FOTO score as well as subjective information and objective measures which is affecting overall participation. Patient will benefit from skilled physical therapy in order to improve function and reduce impairment.    Personal Factors and Comorbidities Age;Comorbidity 3+;Time since onset of injury/illness/exacerbation;Fitness    Comorbidities Anxiety, Arthritis, Asthma, chronic neck, BMI over 30,  Chronic Obstructive Pulmonary Disease, High Blood Pressure    Examination-Activity Limitations Lift;Stand;Bend    Examination-Participation Restrictions Meal Prep;Cleaning;Yard Work;Volunteer;Shop;Laundry;Driving    Stability/Clinical Decision Making Stable/Uncomplicated    Clinical Decision Making Low    Rehab Potential Good    PT Frequency 2x / week    PT Duration 6 weeks    PT Treatment/Interventions ADLs/Self Care Home Management;Aquatic Therapy;Cryotherapy;Electrical Stimulation;Canalith Repostioning;Iontophoresis '4mg'$ /ml Dexamethasone;Moist Heat;Ultrasound;Traction;DME Instruction;Gait training;Stair training;Functional mobility training;Therapeutic activities;Therapeutic exercise;Balance training;Neuromuscular re-education;Patient/family education;Orthotic Fit/Training;Manual techniques;Manual lymph drainage;Compression bandaging;Dry needling;Energy conservation;Scar mobilization;Passive range of  motion;Splinting;Taping;Vestibular;Spinal Manipulations;Joint Manipulations    PT Next Visit Plan f/u with cervical retractions, possibly manual for pain/mobility; cervical mobility, postural strengthening    PT Home Exercise Plan 8/17 cervical retractions in supine    Consulted and Agree with Plan of Care Patient             Patient will benefit from skilled therapeutic intervention in order to improve the following deficits and impairments:  Decreased range of motion, Increased muscle spasms, Decreased endurance, Decreased activity tolerance, Pain, Hypomobility, Impaired flexibility, Improper body mechanics, Decreased mobility, Decreased strength, Postural dysfunction  Visit Diagnosis: Cervicalgia  Other symptoms and signs involving the musculoskeletal system     Problem List Patient Active Problem List   Diagnosis Date Noted   Abdominal pain, left lower quadrant 11/13/2020   11:58 AM, 08/08/21 Mearl Latin PT, DPT Physical Therapist at [place], [place], [address] Phone: [phone]   Fax:  [phone]  Name: Carleena Neupert MRN: [id number] Date of Birth: 1946-01-04

## 2021-08-15 ENCOUNTER — Ambulatory Visit (HOSPITAL_COMMUNITY): Payer: Medicare PPO

## 2021-08-15 ENCOUNTER — Other Ambulatory Visit: Payer: Self-pay

## 2021-08-15 ENCOUNTER — Encounter (HOSPITAL_COMMUNITY): Payer: Self-pay

## 2021-08-15 DIAGNOSIS — R29898 Other symptoms and signs involving the musculoskeletal system: Secondary | ICD-10-CM

## 2021-08-15 DIAGNOSIS — M542 Cervicalgia: Secondary | ICD-10-CM

## 2021-08-15 NOTE — Therapy (Signed)
[place], [place], [address] Phone: [phone]   Fax:  [phone]  Physical Therapy Treatment  Patient Details  Name: Lindsay Wilkerson MRN: [id number] Date of Birth: 1946/11/25 Referring Provider (PT): Sanjuana Kava MD   Encounter Date: 08/15/2021   PT End of Session - 08/15/21 0849     Visit Number 2    Number of Visits 12    Date for PT Re-Evaluation 09/19/21    Authorization Type [employer]    Authorization Time Period 12 visits requested 8/17-9/28/22    Authorization - Visit Number 2    Authorization - Number of Visits 12    Progress Note Due on Visit 10    PT Start Time 0834    PT Stop Time 0913    PT Time Calculation (min) 39 min    Activity Tolerance Patient tolerated treatment well    Behavior During Therapy [facility] for tasks assessed/performed             Past Medical History:  Diagnosis Date   Depression, major    Hypertension    Urinary bladder incontinence     Past Surgical History:  Procedure Laterality Date   EYE SURGERY     IR FALLOPIAN TUBE CATHETERIZATION Left    1980   TONSILLECTOMY     Age 75    There were no vitals filed for this visit.   Subjective Assessment - 08/15/21 0840     Subjective Pt stated she is feeling better today, pain scale 1/10.  Has began HEP without questions.    Patient Stated Goals decrease pain    Currently in Pain? Yes    Pain Score 1     Pain Location Neck    Pain Orientation Right    Pain Descriptors / Indicators Dull    Pain Type Chronic pain    Pain Onset More than a month ago    Pain Frequency Intermittent    Aggravating Factors  Begin up during the day, pillows, lifting    Pain Relieving Factors ice, heat, tylonel                               [facility] Adult PT Treatment/Exercise - 08/15/21 0001       Posture/Postural Control   Posture/Postural Control Postural limitations    Postural Limitations Rounded  Shoulders;Forward head;Increased thoracic kyphosis      Exercises   Exercises Neck      Neck Exercises: Seated   Neck Retraction 10 reps;5 secs    W Back 10 reps    Postural Training Educated importance of posture    Other Seated Exercise 3D cervical excursion 5x      Neck Exercises: Supine   Neck Retraction 10 reps;10 secs    Neck Retraction Limitations Reviewed form, correct mechanics      Manual Therapy   Manual Therapy Soft tissue mobilization    Manual therapy comments Manual complete separate than rest of tx    Soft tissue mobilization Supine position STM to cervical mm, manual traction 2x 1' and suboccipitial release 3x 30"                    PT Education - 08/15/21 0848     Education Details Reviewed goals, educated importance of HEP compliance for maximal benefits.  Educated importance of posture for pain control.    Person(s) Educated Patient  Methods Explanation;Demonstration    Comprehension Verbalized understanding              PT Short Term Goals - 08/08/21 1155       PT SHORT TERM GOAL #1   Title Patient will be independent with HEP in order to improve functional outcomes.    Time 3    Period Weeks    Status New    Target Date 08/29/21      PT SHORT TERM GOAL #2   Title Patient will report at least 25% improvement in symptoms for improved quality of life.    Time 3    Period Weeks    Status New    Target Date 08/29/21               PT Long Term Goals - 08/08/21 1156       PT LONG TERM GOAL #1   Title Patient will report at least 75% improvement in symptoms for improved quality of life.    Time 6    Period Weeks    Status New    Target Date 09/19/21      PT LONG TERM GOAL #2   Title Patient will improve FOTO score by at least 10 points in order to indicate improved tolerance to activity.    Time 6    Period Weeks    Status New    Target Date 09/19/21      PT LONG TERM GOAL #3   Title Patient will demonstrate at  least 25% improvement in cervical ROM in all restricted planes for improved ability to move head while at home.    Time 6    Period Weeks    Status New    Target Date 09/19/21                   Plan - 08/15/21 1908     Clinical Impression Statement Reviewed goals, educated importance of HEP compliance for maximal benefits, pt able to recall and demonstrate appropriate form with current exercise program.  Pt educated on importance of posture for pain control.  THerex focus on cervical mobility in pain free range and postural strengthening.  EOS with manual to address spasm Rt UT and improve cervical mobility.    Personal Factors and Comorbidities Age;Comorbidity 3+;Time since onset of injury/illness/exacerbation;Fitness    Comorbidities Anxiety, Arthritis, Asthma, chronic neck, BMI over 30,  Chronic Obstructive Pulmonary Disease, High Blood Pressure    Examination-Activity Limitations Lift;Stand;Bend    Examination-Participation Restrictions Meal Prep;Cleaning;Yard Work;Volunteer;Shop;Laundry;Driving    Stability/Clinical Decision Making Stable/Uncomplicated    Clinical Decision Making Low    Rehab Potential Good    PT Frequency 2x / week    PT Duration 6 weeks    PT Treatment/Interventions ADLs/Self Care Home Management;Aquatic Therapy;Cryotherapy;Electrical Stimulation;Canalith Repostioning;Iontophoresis '4mg'$ /ml Dexamethasone;Moist Heat;Ultrasound;Traction;DME Instruction;Gait training;Stair training;Functional mobility training;Therapeutic activities;Therapeutic exercise;Balance training;Neuromuscular re-education;Patient/family education;Orthotic Fit/Training;Manual techniques;Manual lymph drainage;Compression bandaging;Dry needling;Energy conservation;Scar mobilization;Passive range of motion;Splinting;Taping;Vestibular;Spinal Manipulations;Joint Manipulations    PT Next Visit Plan F/U with HEP compliance.  cervical mobility, postural strengthening, possibly manual for pain/mobility     PT Home Exercise Plan 8/17 cervical retractions in supine; 8/24: seated chin tuck    Consulted and Agree with Plan of Care Patient             Patient will benefit from skilled therapeutic intervention in order to improve the following deficits and impairments:  Decreased range of motion, Increased muscle spasms, Decreased endurance, Decreased activity tolerance, Pain,  Hypomobility, Impaired flexibility, Improper body mechanics, Decreased mobility, Decreased strength, Postural dysfunction  Visit Diagnosis: Cervicalgia  Other symptoms and signs involving the musculoskeletal system     Problem List Patient Active Problem List   Diagnosis Date Noted   Abdominal pain, left lower quadrant 11/13/2020   Ihor Austin, LPTA/CLT; CBIS [phone] ' Aldona Lento 08/15/2021, 7:12 PM  [facility] [address] [place], [place], [address] Phone: [phone]   Fax:  [phone]  Name: Lindsay Wilkerson MRN: [id number] Date of Birth: 30-Mar-1946

## 2021-08-22 ENCOUNTER — Other Ambulatory Visit: Payer: Self-pay

## 2021-08-22 ENCOUNTER — Ambulatory Visit (HOSPITAL_COMMUNITY): Payer: Medicare PPO | Admitting: Physical Therapy

## 2021-08-22 DIAGNOSIS — M542 Cervicalgia: Secondary | ICD-10-CM | POA: Diagnosis not present

## 2021-08-22 DIAGNOSIS — R29898 Other symptoms and signs involving the musculoskeletal system: Secondary | ICD-10-CM

## 2021-08-22 NOTE — Therapy (Signed)
[place], [place], [address] Phone: [phone]   Fax:  [phone]  Physical Therapy Treatment  Patient Details  Name: Lindsay Wilkerson MRN: [id number] Date of Birth: 14-Mar-1946 Referring Provider (PT): Sanjuana Kava MD   Encounter Date: 08/22/2021   PT End of Session - 08/22/21 1449     Visit Number 3    Number of Visits 12    Date for PT Re-Evaluation 09/19/21    Authorization Type [employer]    Authorization Time Period 12 visits requested 8/17-9/28/22    Authorization - Visit Number 2    Authorization - Number of Visits 12    Progress Note Due on Visit 10    PT Start Time 1450    PT Stop Time 1530    PT Time Calculation (min) 40 min    Activity Tolerance Patient tolerated treatment well    Behavior During Therapy [facility] for tasks assessed/performed             Past Medical History:  Diagnosis Date   Depression, major    Hypertension    Urinary bladder incontinence     Past Surgical History:  Procedure Laterality Date   EYE SURGERY     IR FALLOPIAN TUBE CATHETERIZATION Left    1980   TONSILLECTOMY     Age 75    There were no vitals filed for this visit.   Subjective Assessment - 08/22/21 1518     Subjective Pt states that at times she has pain going about 1/2 way down her shoulder, this occurs about 5x a week  and lasts for about five minutes    Patient Stated Goals decrease pain    Currently in Pain? Yes    Pain Score 4     Pain Location Neck    Pain Orientation Right    Pain Descriptors / Indicators Aching    Pain Onset More than a month ago    Pain Frequency Intermittent    Aggravating Factors  lifting    Pain Relieving Factors heat ice and tylenol                               [facility] Adult PT Treatment/Exercise - 08/22/21 0001       Exercises   Exercises Neck      Neck Exercises: Seated   Neck Retraction 10 reps;5 secs    Neck Retraction  Limitations scapular retraction x 10    Shoulder Rolls Backwards;10 reps    Other Seated Exercise cervical and thoracic excursion x 3    Other Seated Exercise cervcical extension with SNAG      Neck Exercises: Supine   Cervical Isometrics Extension;Right lateral flexion;Left lateral flexion;5 secs;5 reps    Neck Retraction 10 reps    Capital Flexion 5 reps    Shoulder Flexion Both;5 reps      Manual Therapy   Manual Therapy Soft tissue mobilization;Manual Traction    Manual therapy comments Manual complete separate than rest of tx    Soft tissue mobilization Supine position STM to cervical mm, manual traction 2x 1' and suboccipitial release 3x 30"    Manual Traction to reduce neural compression                      PT Short Term Goals - 08/08/21 1155       PT SHORT TERM GOAL #1  Title Patient will be independent with HEP in order to improve functional outcomes.    Time 3    Period Weeks    Status New    Target Date 08/29/21      PT SHORT TERM GOAL #2   Title Patient will report at least 25% improvement in symptoms for improved quality of life.    Time 3    Period Weeks    Status New    Target Date 08/29/21               PT Long Term Goals - 08/08/21 1156       PT LONG TERM GOAL #1   Title Patient will report at least 75% improvement in symptoms for improved quality of life.    Time 6    Period Weeks    Status New    Target Date 09/19/21      PT LONG TERM GOAL #2   Title Patient will improve FOTO score by at least 10 points in order to indicate improved tolerance to activity.    Time 6    Period Weeks    Status New    Target Date 09/19/21      PT LONG TERM GOAL #3   Title Patient will demonstrate at least 25% improvement in cervical ROM in all restricted planes for improved ability to move head while at home.    Time 6    Period Weeks    Status New    Target Date 09/19/21                   Plan - 08/22/21 1450     Clinical  Impression Statement Added cervical stabilization exercises while supine as well as thoracic excursions to improve motion.    Personal Factors and Comorbidities Age;Comorbidity 3+;Time since onset of injury/illness/exacerbation;Fitness    Comorbidities Anxiety, Arthritis, Asthma, chronic neck, BMI over 30,  Chronic Obstructive Pulmonary Disease, High Blood Pressure    Examination-Activity Limitations Lift;Stand;Bend    Examination-Participation Restrictions Meal Prep;Cleaning;Yard Work;Volunteer;Shop;Laundry;Driving    Stability/Clinical Decision Making Stable/Uncomplicated    Rehab Potential Good    PT Frequency 2x / week    PT Duration 6 weeks    PT Treatment/Interventions ADLs/Self Care Home Management;Aquatic Therapy;Cryotherapy;Electrical Stimulation;Canalith Repostioning;Iontophoresis '4mg'$ /ml Dexamethasone;Moist Heat;Ultrasound;Traction;DME Instruction;Gait training;Stair training;Functional mobility training;Therapeutic activities;Therapeutic exercise;Balance training;Neuromuscular re-education;Patient/family education;Orthotic Fit/Training;Manual techniques;Manual lymph drainage;Compression bandaging;Dry needling;Energy conservation;Scar mobilization;Passive range of motion;Splinting;Taping;Vestibular;Spinal Manipulations;Joint Manipulations    PT Next Visit Plan F/U with HEP compliance.  cervical mobility, postural strengthening,  manual for pain/mobility    PT Home Exercise Plan 8/17 cervical retractions in supine; 8/24: seated chin tuck    Consulted and Agree with Plan of Care Patient             Patient will benefit from skilled therapeutic intervention in order to improve the following deficits and impairments:  Decreased range of motion, Increased muscle spasms, Decreased endurance, Decreased activity tolerance, Pain, Hypomobility, Impaired flexibility, Improper body mechanics, Decreased mobility, Decreased strength, Postural dysfunction  Visit Diagnosis: Cervicalgia  Other  symptoms and signs involving the musculoskeletal system     Problem List Patient Active Problem List   Diagnosis Date Noted   Abdominal pain, left lower quadrant 11/13/2020  Rayetta Humphrey, PT CLT [phone]  08/22/2021, 3:32 PM  [place], [place], [address] Phone: [phone]   Fax:  [phone]  Name: Daneisha Wambach MRN: [id number] Date of Birth: 1946/07/29

## 2021-08-23 ENCOUNTER — Ambulatory Visit (HOSPITAL_COMMUNITY): Payer: Medicare PPO | Attending: Orthopaedic Surgery | Admitting: Physical Therapy

## 2021-08-23 ENCOUNTER — Encounter (HOSPITAL_COMMUNITY): Payer: Self-pay | Admitting: Physical Therapy

## 2021-08-23 ENCOUNTER — Ambulatory Visit (INDEPENDENT_AMBULATORY_CARE_PROVIDER_SITE_OTHER): Payer: Medicare PPO | Admitting: Orthopaedic Surgery

## 2021-08-23 ENCOUNTER — Encounter: Payer: Self-pay | Admitting: Orthopaedic Surgery

## 2021-08-23 VITALS — BP 168/68 | HR 51 | Ht 68.0 in | Wt 210.4 lb

## 2021-08-23 DIAGNOSIS — M542 Cervicalgia: Secondary | ICD-10-CM

## 2021-08-23 DIAGNOSIS — M5410 Radiculopathy, site unspecified: Secondary | ICD-10-CM | POA: Diagnosis not present

## 2021-08-23 DIAGNOSIS — G8929 Other chronic pain: Secondary | ICD-10-CM

## 2021-08-23 DIAGNOSIS — R29898 Other symptoms and signs involving the musculoskeletal system: Secondary | ICD-10-CM | POA: Diagnosis present

## 2021-08-23 NOTE — Progress Notes (Signed)
My neck is perhaps a little better.  She saw the neurosurgeon about her neck.  He has recommended surgery.  She wants to wait.  I have reviewed the note.  She has been to PT and it is helping some.  She has less numbness.  She is sleeping better.  I spent about 30 minutes talking with her and her situation.    She also has heel pain. I talked about stretching exercises.  ROM of the neck is good.  She has right sided pain and slight weakness of right triceps.    Encounter Diagnoses  Name Primary?   Chronic neck pain Yes   Radicular pain of upper extremity    I will see her in five weeks.  She has about a month of PT to do.  Call if any problem.  Precautions discussed.  Electronically Signed Sanjuana Kava, MD 9/1/20229:47 AM

## 2021-08-23 NOTE — Therapy (Signed)
[place], [place], [address] Phone: [phone]   Fax:  [phone]  Physical Therapy Treatment  Patient Details  Name: Lindsay Wilkerson MRN: [id number] Date of Birth: 07-03-1946 Referring Provider (PT): Sanjuana Kava MD   Encounter Date: 08/23/2021   PT End of Session - 08/23/21 0748     Visit Number 4    Number of Visits 12    Date for PT Re-Evaluation 09/19/21    Authorization Type [employer]    Authorization Time Period 12 visits requested 8/17-9/28/22    Authorization - Visit Number 4    Authorization - Number of Visits 12    Progress Note Due on Visit 10    PT Start Time [phone]   late to check in   PT Stop Time 0826    PT Time Calculation (min) 38 min    Activity Tolerance Patient tolerated treatment well    Behavior During Therapy [facility] for tasks assessed/performed             Past Medical History:  Diagnosis Date   Depression, major    Hypertension    Urinary bladder incontinence     Past Surgical History:  Procedure Laterality Date   EYE SURGERY     IR FALLOPIAN TUBE CATHETERIZATION Left    1980   TONSILLECTOMY     Age 75    There were no vitals filed for this visit.   Subjective Assessment - 08/23/21 0754     Subjective Feeling better. States she has no current pain but knows it's there. States she does something every day. States that she is planning on using an app to help log in her activities.    Patient Stated Goals decrease pain    Currently in Pain? No/denies    Pain Onset More than a month ago                [facility] PT Assessment - 08/23/21 0001       Assessment   Medical Diagnosis Chronic Neck pain    Referring Provider (PT) Sanjuana Kava MD                           [facility] Adult PT Treatment/Exercise - 08/23/21 0001       Neck Exercises: Standing   Other Standing Exercises w's at wall wtih towel behind spine - tolerated poorly deltoid  spasms      Neck Exercises: Seated   Other Seated Exercise seated add isometric x15 5" holds R;      Neck Exercises: Supine   Cervical Rotation Both;5 reps   10" holds each   Lateral Flexion Left;10 reps   15 " holds supine   Other Supine Exercise protraction x15 5" holds      Manual Therapy   Manual Therapy Soft tissue mobilization;Manual Traction    Manual therapy comments Manual complete separate than rest of tx    Soft tissue mobilization Supine position STM to cervical mm, suboccipitals adn UT with and wiohtut PROM; to right deltoid    Manual Traction suboccipital traction as tolerated 3 minutes                    PT Education - 08/23/21 0829     Education Details on causes for muscle cramps/spasms - dehyrdation, low potassium/magnesium- discussed foods that contain these items    Person(s) Educated Patient    Methods  Explanation    Comprehension Verbalized understanding              PT Short Term Goals - 08/08/21 1155       PT SHORT TERM GOAL #1   Title Patient will be independent with HEP in order to improve functional outcomes.    Time 3    Period Weeks    Status New    Target Date 08/29/21      PT SHORT TERM GOAL #2   Title Patient will report at least 25% improvement in symptoms for improved quality of life.    Time 3    Period Weeks    Status New    Target Date 08/29/21               PT Long Term Goals - 08/08/21 1156       PT LONG TERM GOAL #1   Title Patient will report at least 75% improvement in symptoms for improved quality of life.    Time 6    Period Weeks    Status New    Target Date 09/19/21      PT LONG TERM GOAL #2   Title Patient will improve FOTO score by at least 10 points in order to indicate improved tolerance to activity.    Time 6    Period Weeks    Status New    Target Date 09/19/21      PT LONG TERM GOAL #3   Title Patient will demonstrate at least 25% improvement in cervical ROM in all restricted planes  for improved ability to move head while at home.    Time 6    Period Weeks    Status New    Target Date 09/19/21                   Plan - 08/23/21 [id number]     Clinical Impression Statement Session focused on cervical mobility which was tolerated well. Reduced pain with ROM noted after stretches and manual work. Continued tenderness along right UT and referred pain down with arm with end range right rotation. This resolved with rest but with W's at wall right deltoid spasmed. This reduced with shoulder adduction isometric but continued discomfort noted in shoulder end of session. Educated patient in muscle spasm causes and trying to stay hydrated.    Personal Factors and Comorbidities Age;Comorbidity 3+;Time since onset of injury/illness/exacerbation;Fitness    Comorbidities Anxiety, Arthritis, Asthma, chronic neck, BMI over 30,  Chronic Obstructive Pulmonary Disease, High Blood Pressure    Examination-Activity Limitations Lift;Stand;Bend    Examination-Participation Restrictions Meal Prep;Cleaning;Yard Work;Volunteer;Shop;Laundry;Driving    Stability/Clinical Decision Making Stable/Uncomplicated    Rehab Potential Good    PT Frequency 2x / week    PT Duration 6 weeks    PT Treatment/Interventions ADLs/Self Care Home Management;Aquatic Therapy;Cryotherapy;Electrical Stimulation;Canalith Repostioning;Iontophoresis '4mg'$ /ml Dexamethasone;Moist Heat;Ultrasound;Traction;DME Instruction;Gait training;Stair training;Functional mobility training;Therapeutic activities;Therapeutic exercise;Balance training;Neuromuscular re-education;Patient/family education;Orthotic Fit/Training;Manual techniques;Manual lymph drainage;Compression bandaging;Dry needling;Energy conservation;Scar mobilization;Passive range of motion;Splinting;Taping;Vestibular;Spinal Manipulations;Joint Manipulations    PT Next Visit Plan F/U with HEP compliance.  cervical mobility, postural strengthening,  manual for pain/mobility     PT Home Exercise Plan 8/17 cervical retractions in supine; 8/24: seated chin tuck    Consulted and Agree with Plan of Care Patient             Patient will benefit from skilled therapeutic intervention in order to improve the following deficits and impairments:  Decreased range of motion, Increased muscle  spasms, Decreased endurance, Decreased activity tolerance, Pain, Hypomobility, Impaired flexibility, Improper body mechanics, Decreased mobility, Decreased strength, Postural dysfunction  Visit Diagnosis: Cervicalgia  Other symptoms and signs involving the musculoskeletal system     Problem List Patient Active Problem List   Diagnosis Date Noted   Abdominal pain, left lower quadrant 11/13/2020   9:29 AM, 08/23/21 Jerene Pitch, DPT Physical Therapy with [facility]  [phone] office   [place], [place], [address] Phone: [phone]   Fax:  [phone]  Name: Lindsay Wilkerson MRN: [id number] Date of Birth: 1946-05-23

## 2021-08-29 ENCOUNTER — Ambulatory Visit (HOSPITAL_COMMUNITY): Payer: Medicare PPO | Admitting: Physical Therapy

## 2021-08-29 ENCOUNTER — Encounter (HOSPITAL_COMMUNITY): Payer: Self-pay | Admitting: Physical Therapy

## 2021-08-29 ENCOUNTER — Other Ambulatory Visit: Payer: Self-pay

## 2021-08-29 DIAGNOSIS — M542 Cervicalgia: Secondary | ICD-10-CM

## 2021-08-29 DIAGNOSIS — R29898 Other symptoms and signs involving the musculoskeletal system: Secondary | ICD-10-CM

## 2021-08-29 NOTE — Therapy (Signed)
[facility] [address] [place], [place], [address] Phone: [phone]   Fax:  [phone]  Physical Therapy Treatment  Patient Details  Name: Lindsay Wilkerson MRN: [id number] Date of Birth: September 28, 1946 Referring Provider (PT): Sanjuana Kava MD   Encounter Date: 08/29/2021   PT End of Session - 08/29/21 1053     Visit Number 5    Number of Visits 12    Date for PT Re-Evaluation 09/19/21    Authorization Type [employer]    Authorization Time Period 12 visits requested 8/17-9/28/22    Authorization - Visit Number 5    Authorization - Number of Visits 12    Progress Note Due on Visit 10    PT Start Time 1052   arrives late   PT Stop Time 1130    PT Time Calculation (min) 38 min    Activity Tolerance Patient tolerated treatment well    Behavior During Therapy [facility] for tasks assessed/performed             Past Medical History:  Diagnosis Date   Depression, major    Hypertension    Urinary bladder incontinence     Past Surgical History:  Procedure Laterality Date   EYE SURGERY     IR FALLOPIAN TUBE CATHETERIZATION Left    1980   TONSILLECTOMY     Age 75    There were no vitals filed for this visit.   Subjective Assessment - 08/29/21 1054     Subjective Patient states her neck is doing a little better. She needs to step up her game with her exercises.    Patient Stated Goals decrease pain    Currently in Pain? Yes    Pain Score 2     Pain Location Shoulder    Pain Orientation Right    Pain Descriptors / Indicators Aching    Pain Type Chronic pain    Pain Onset More than a month ago    Pain Frequency Constant                               [facility] Adult PT Treatment/Exercise - 08/29/21 0001       Neck Exercises: Supine   Neck Retraction 10 reps;3 secs    Cervical Rotation Both;5 reps    Cervical Rotation Limitations 10 second holds    Lateral Flexion Both;5 reps    Lateral Flexion  Limitations 10 second holds    Other Supine Exercise decompression with band 2x 10 each red band                  Upper Extremity Functional Index Score :   /80   PT Education - 08/29/21 1053     Education Details HEP, patient educated on PT progressions, goals, improving impairments    Person(s) Educated Patient    Methods Explanation;Handout    Comprehension Verbalized understanding              PT Short Term Goals - 08/08/21 1155       PT SHORT TERM GOAL #1   Title Patient will be independent with HEP in order to improve functional outcomes.    Time 3    Period Weeks    Status New    Target Date 08/29/21      PT SHORT TERM GOAL #2   Title Patient will report at least 25% improvement in symptoms for improved  quality of life.    Time 3    Period Weeks    Status New    Target Date 08/29/21               PT Long Term Goals - 08/08/21 1156       PT LONG TERM GOAL #1   Title Patient will report at least 75% improvement in symptoms for improved quality of life.    Time 6    Period Weeks    Status New    Target Date 09/19/21      PT LONG TERM GOAL #2   Title Patient will improve FOTO score by at least 10 points in order to indicate improved tolerance to activity.    Time 6    Period Weeks    Status New    Target Date 09/19/21      PT LONG TERM GOAL #3   Title Patient will demonstrate at least 25% improvement in cervical ROM in all restricted planes for improved ability to move head while at home.    Time 6    Period Weeks    Status New    Target Date 09/19/21                   Plan - 08/29/21 1053     Clinical Impression Statement Patient with good mechanics with previously completed exercises with min to no cueing. Began decompression series with band for improved shoulder and periscapular strength. Educated patient on improving impairments to improve symptoms and function. Patient will continue to benefit from skilled physical  therapy in order to reduce impairment and improve function.    Personal Factors and Comorbidities Age;Comorbidity 3+;Time since onset of injury/illness/exacerbation;Fitness    Comorbidities Anxiety, Arthritis, Asthma, chronic neck, BMI over 30,  Chronic Obstructive Pulmonary Disease, High Blood Pressure    Examination-Activity Limitations Lift;Stand;Bend    Examination-Participation Restrictions Meal Prep;Cleaning;Yard Work;Volunteer;Shop;Laundry;Driving    Stability/Clinical Decision Making Stable/Uncomplicated    Rehab Potential Good    PT Frequency 2x / week    PT Duration 6 weeks    PT Treatment/Interventions ADLs/Self Care Home Management;Aquatic Therapy;Cryotherapy;Electrical Stimulation;Canalith Repostioning;Iontophoresis '4mg'$ /ml Dexamethasone;Moist Heat;Ultrasound;Traction;DME Instruction;Gait training;Stair training;Functional mobility training;Therapeutic activities;Therapeutic exercise;Balance training;Neuromuscular re-education;Patient/family education;Orthotic Fit/Training;Manual techniques;Manual lymph drainage;Compression bandaging;Dry needling;Energy conservation;Scar mobilization;Passive range of motion;Splinting;Taping;Vestibular;Spinal Manipulations;Joint Manipulations    PT Next Visit Plan F/U with HEP compliance.  cervical mobility, postural strengthening,  manual for pain/mobility    PT Home Exercise Plan 8/17 cervical retractions in supine; 8/24: seated chin tuck 9/7 decompression with band    Consulted and Agree with Plan of Care Patient             Patient will benefit from skilled therapeutic intervention in order to improve the following deficits and impairments:  Decreased range of motion, Increased muscle spasms, Decreased endurance, Decreased activity tolerance, Pain, Hypomobility, Impaired flexibility, Improper body mechanics, Decreased mobility, Decreased strength, Postural dysfunction  Visit Diagnosis: Cervicalgia  Other symptoms and signs involving the  musculoskeletal system     Problem List Patient Active Problem List   Diagnosis Date Noted   Abdominal pain, left lower quadrant 11/13/2020    11:31 AM, 08/29/21 Mearl Latin PT, DPT Physical Therapist at [facility] [place], [place], [address] Phone: [phone]   Fax:  [phone]  Name: Lindsay Wilkerson MRN: [id number] Date of Birth: 1946-01-09

## 2021-08-31 ENCOUNTER — Encounter (HOSPITAL_COMMUNITY): Payer: Medicare PPO | Admitting: Physical Therapy

## 2021-09-04 ENCOUNTER — Ambulatory Visit (HOSPITAL_COMMUNITY): Payer: Medicare PPO

## 2021-09-04 ENCOUNTER — Encounter (HOSPITAL_COMMUNITY): Payer: Self-pay

## 2021-09-04 ENCOUNTER — Other Ambulatory Visit: Payer: Self-pay

## 2021-09-04 DIAGNOSIS — R29898 Other symptoms and signs involving the musculoskeletal system: Secondary | ICD-10-CM

## 2021-09-04 DIAGNOSIS — M542 Cervicalgia: Secondary | ICD-10-CM

## 2021-09-04 NOTE — Patient Instructions (Signed)
Access Code: [email or web address] URL: [email or web address] Date: 09/04/2021 Prepared by: Sherlyn Lees  Exercises Standing Bent Over Single Arm Scapular Row with Table Support with PLB - 1 x daily - 7 x weekly - 3 sets - 10 reps Standing Bicep Curls Supinated with Dumbbells - 1 x daily - 7 x weekly - 3 sets - 10 reps Supine Deep Neck Flexor Training - Repetitions - 1 x daily - 7 x weekly - 3 sets - 10 reps - 2 sec hold Supine Shoulder Flexion with Free Weight - 1 x daily - 7 x weekly - 3 sets - 10 reps

## 2021-09-04 NOTE — Therapy (Signed)
[facility] [place], [place], [address] Phone: [phone]   Fax:  [phone]  Physical Therapy Treatment  Patient Details  Name: Lindsay Wilkerson MRN: [id number] Date of Birth: 1946-10-03 Referring Provider (PT): Sanjuana Kava MD   Encounter Date: 09/04/2021   PT End of Session - 09/04/21 0909     Visit Number 6    Number of Visits 12    Date for PT Re-Evaluation 09/19/21    Authorization Type [employer]    Authorization Time Period 12 visits requested 8/17-9/28/22    Authorization - Visit Number 6    Authorization - Number of Visits 12    Progress Note Due on Visit 10    PT Start Time 0902    PT Stop Time 0945    PT Time Calculation (min) 43 min    Activity Tolerance Patient tolerated treatment well    Behavior During Therapy [facility] for tasks assessed/performed             Past Medical History:  Diagnosis Date   Depression, major    Hypertension    Urinary bladder incontinence     Past Surgical History:  Procedure Laterality Date   EYE SURGERY     IR FALLOPIAN TUBE CATHETERIZATION Left    1980   TONSILLECTOMY     Age 75    There were no vitals filed for this visit.   Subjective Assessment - 09/04/21 0907     Subjective Neck is doing quite a bit better, still some pain in right shoulder when folding laundry    Patient Stated Goals decrease pain    Currently in Pain? Yes    Pain Score 1     Pain Location Neck    Pain Orientation Right    Pain Descriptors / Indicators Aching    Pain Type Chronic pain    Pain Onset More than a month ago    Aggravating Factors  lifting, folding laundry                [facility] PT Assessment - 09/04/21 0001       Assessment   Medical Diagnosis Chronic Neck pain    Referring Provider (PT) Sanjuana Kava MD    Onset Date/Surgical Date 08/08/20    Next MD Visit end of September                           [facility] Adult PT Treatment/Exercise -  09/04/21 0001       Exercises   Exercises Shoulder      Neck Exercises: Seated   Neck Retraction 10 reps;3 secs    Cervical Rotation Both;10 reps    Lateral Flexion Both;10 reps    W Back 20 reps      Neck Exercises: Supine   Neck Retraction 10 reps;3 secs    Cervical Rotation Both;5 reps    Lateral Flexion Both;5 reps    Other Supine Exercise decompression with band 2x 10 each red band      Shoulder Exercises: Standing   Row Strengthening;Both;20 reps;Theraband    Theraband Level (Shoulder Row) Level 2 (Red)    Row Limitations with slight chin retraction    Other Standing Exercises bicep curls 2x10 2# with chin retraction    Other Standing Exercises single arm bent-over row with 4 lbs 1x10  PT Short Term Goals - 08/08/21 1155       PT SHORT TERM GOAL #1   Title Patient will be independent with HEP in order to improve functional outcomes.    Time 3    Period Weeks    Status New    Target Date 08/29/21      PT SHORT TERM GOAL #2   Title Patient will report at least 25% improvement in symptoms for improved quality of life.    Time 3    Period Weeks    Status New    Target Date 08/29/21               PT Long Term Goals - 08/08/21 1156       PT LONG TERM GOAL #1   Title Patient will report at least 75% improvement in symptoms for improved quality of life.    Time 6    Period Weeks    Status New    Target Date 09/19/21      PT LONG TERM GOAL #2   Title Patient will improve FOTO score by at least 10 points in order to indicate improved tolerance to activity.    Time 6    Period Weeks    Status New    Target Date 09/19/21      PT LONG TERM GOAL #3   Title Patient will demonstrate at least 25% improvement in cervical ROM in all restricted planes for improved ability to move head while at home.    Time 6    Period Weeks    Status New    Target Date 09/19/21                   Plan - 09/04/21 0945      Clinical Impression Statement Demonstrates right lateral shoulder pain with overhead movements and resisted scaption.  Improved mechanics with 25% verbal/tactile cues for chin/neck posture during resisted activities.  Prgoressing well with postural re-eduication and tolerating increased resistance activities without adverse effects. Continued sessions indicated to improve scapular and cervical strength    Personal Factors and Comorbidities Age;Comorbidity 3+;Time since onset of injury/illness/exacerbation;Fitness    Comorbidities Anxiety, Arthritis, Asthma, chronic neck, BMI over 30,  Chronic Obstructive Pulmonary Disease, High Blood Pressure    Examination-Activity Limitations Lift;Stand;Bend    Examination-Participation Restrictions Meal Prep;Cleaning;Yard Work;Volunteer;Shop;Laundry;Driving    Stability/Clinical Decision Making Stable/Uncomplicated    Rehab Potential Good    PT Frequency 2x / week    PT Duration 6 weeks    PT Treatment/Interventions ADLs/Self Care Home Management;Aquatic Therapy;Cryotherapy;Electrical Stimulation;Canalith Repostioning;Iontophoresis '4mg'$ /ml Dexamethasone;Moist Heat;Ultrasound;Traction;DME Instruction;Gait training;Stair training;Functional mobility training;Therapeutic activities;Therapeutic exercise;Balance training;Neuromuscular re-education;Patient/family education;Orthotic Fit/Training;Manual techniques;Manual lymph drainage;Compression bandaging;Dry needling;Energy conservation;Scar mobilization;Passive range of motion;Splinting;Taping;Vestibular;Spinal Manipulations;Joint Manipulations    PT Next Visit Plan F/U with HEP compliance.  cervical mobility, postural strengthening,  manual for pain/mobility    PT Home Exercise Plan 8/17 cervical retractions in supine; 8/24: seated chin tuck 9/7 decompression with band    Consulted and Agree with Plan of Care Patient             Patient will benefit from skilled therapeutic intervention in order to improve the  following deficits and impairments:  Decreased range of motion, Increased muscle spasms, Decreased endurance, Decreased activity tolerance, Pain, Hypomobility, Impaired flexibility, Improper body mechanics, Decreased mobility, Decreased strength, Postural dysfunction  Visit Diagnosis: Cervicalgia  Other symptoms and signs involving the musculoskeletal system     Problem List Patient Active Problem List  Diagnosis Date Noted   Abdominal pain, left lower quadrant 11/13/2020    Toniann Fail, PT 09/04/2021, 9:47 AM  [facility] [place], [place], [address] Phone: [phone]   Fax:  [phone]  Name: Lindsay Wilkerson MRN: [id number] Date of Birth: 1946-09-11

## 2021-09-06 ENCOUNTER — Encounter (HOSPITAL_COMMUNITY): Payer: Self-pay | Admitting: Physical Therapy

## 2021-09-06 ENCOUNTER — Ambulatory Visit (HOSPITAL_COMMUNITY): Payer: Medicare PPO | Admitting: Physical Therapy

## 2021-09-06 ENCOUNTER — Other Ambulatory Visit: Payer: Self-pay

## 2021-09-06 DIAGNOSIS — R29898 Other symptoms and signs involving the musculoskeletal system: Secondary | ICD-10-CM

## 2021-09-06 DIAGNOSIS — M542 Cervicalgia: Secondary | ICD-10-CM | POA: Diagnosis not present

## 2021-09-06 NOTE — Therapy (Signed)
[place], [place], [address] Phone: [phone]   Fax:  [phone]  Physical Therapy Treatment  Patient Details  Name: Lindsay Wilkerson MRN: [id number] Date of Birth: Apr 14, 1946 Referring Provider (PT): Sanjuana Kava MD   Encounter Date: 09/06/2021   PT End of Session - 09/06/21 0921     Visit Number 7    Number of Visits 12    Date for PT Re-Evaluation 09/19/21    Authorization Type [employer]    Authorization Time Period 12 visits requested 8/17-9/28/22    Authorization - Visit Number 7    Authorization - Number of Visits 12    Progress Note Due on Visit 10    PT Start Time 0921    PT Stop Time 0959    PT Time Calculation (min) 38 min    Activity Tolerance Patient tolerated treatment well    Behavior During Therapy [facility] for tasks assessed/performed             Past Medical History:  Diagnosis Date   Depression, major    Hypertension    Urinary bladder incontinence     Past Surgical History:  Procedure Laterality Date   EYE SURGERY     IR FALLOPIAN TUBE CATHETERIZATION Left    1980   TONSILLECTOMY     Age 27    There were no vitals filed for this visit.   Subjective Assessment - 09/06/21 0922     Subjective Patient states symptoms are some better. She has been adding heat and ice. Tylenol doesn't seem to help. Exercises are going alright. Continues to have neck and shoulder symptoms.    Patient Stated Goals decrease pain    Currently in Pain? Yes    Pain Score 1     Pain Location Neck    Pain Orientation Right    Pain Descriptors / Indicators Aching    Pain Type Chronic pain    Pain Onset More than a month ago    Pain Frequency Constant                               [facility] Adult PT Treatment/Exercise - 09/06/21 0001       Neck Exercises: Standing   Neck Retraction 10 secs;10 reps    Neck Retraction Limitations at wall    Other Standing Exercises  decompression with band 2x 10 bilateral with red band      Shoulder Exercises: Standing   Extension Both;10 reps;Theraband    Theraband Level (Shoulder Extension) Level 2 (Red)    Extension Limitations 2 sets    Row Both;10 reps    Theraband Level (Shoulder Row) Level 2 (Red)    Row Limitations with slight chin retraction, 2 sets    Other Standing Exercises bicep curls 2x10 2# with chin retraction                     PT Education - 09/06/21 0922     Education Details HEP    Person(s) Educated Patient    Methods Explanation    Comprehension Verbalized understanding              PT Short Term Goals - 08/08/21 1155       PT SHORT TERM GOAL #1   Title Patient will be independent with HEP in order to improve functional outcomes.    Time 3  Period Weeks    Status New    Target Date 08/29/21      PT SHORT TERM GOAL #2   Title Patient will report at least 25% improvement in symptoms for improved quality of life.    Time 3    Period Weeks    Status New    Target Date 08/29/21               PT Long Term Goals - 08/08/21 1156       PT LONG TERM GOAL #1   Title Patient will report at least 75% improvement in symptoms for improved quality of life.    Time 6    Period Weeks    Status New    Target Date 09/19/21      PT LONG TERM GOAL #2   Title Patient will improve FOTO score by at least 10 points in order to indicate improved tolerance to activity.    Time 6    Period Weeks    Status New    Target Date 09/19/21      PT LONG TERM GOAL #3   Title Patient will demonstrate at least 25% improvement in cervical ROM in all restricted planes for improved ability to move head while at home.    Time 6    Period Weeks    Status New    Target Date 09/19/21                   Plan - 09/06/21 [id number]     Clinical Impression Statement Began decompression with band exercises in standing for continued postural and periscapular strengthening. Completes  at wall for postural cueing and requires intermittent cueing for mechanics and avoiding compensation. Patient with impaired mechanics with increasing fatigue. Cueing for posture throughout session. Patient will continue to benefit from skilled physical therapy in order to reduce impairment and improve function.    Personal Factors and Comorbidities Age;Comorbidity 3+;Time since onset of injury/illness/exacerbation;Fitness    Comorbidities Anxiety, Arthritis, Asthma, chronic neck, BMI over 30,  Chronic Obstructive Pulmonary Disease, High Blood Pressure    Examination-Activity Limitations Lift;Stand;Bend    Examination-Participation Restrictions Meal Prep;Cleaning;Yard Work;Volunteer;Shop;Laundry;Driving    Stability/Clinical Decision Making Stable/Uncomplicated    Rehab Potential Good    PT Frequency 2x / week    PT Duration 6 weeks    PT Treatment/Interventions ADLs/Self Care Home Management;Aquatic Therapy;Cryotherapy;Electrical Stimulation;Canalith Repostioning;Iontophoresis '4mg'$ /ml Dexamethasone;Moist Heat;Ultrasound;Traction;DME Instruction;Gait training;Stair training;Functional mobility training;Therapeutic activities;Therapeutic exercise;Balance training;Neuromuscular re-education;Patient/family education;Orthotic Fit/Training;Manual techniques;Manual lymph drainage;Compression bandaging;Dry needling;Energy conservation;Scar mobilization;Passive range of motion;Splinting;Taping;Vestibular;Spinal Manipulations;Joint Manipulations    PT Next Visit Plan F/U with HEP compliance.  cervical mobility, postural strengthening,  manual for pain/mobility    PT Home Exercise Plan 8/17 cervical retractions in supine; 8/24: seated chin tuck 9/7 decompression with band    Consulted and Agree with Plan of Care Patient             Patient will benefit from skilled therapeutic intervention in order to improve the following deficits and impairments:  Decreased range of motion, Increased muscle spasms,  Decreased endurance, Decreased activity tolerance, Pain, Hypomobility, Impaired flexibility, Improper body mechanics, Decreased mobility, Decreased strength, Postural dysfunction  Visit Diagnosis: Cervicalgia  Other symptoms and signs involving the musculoskeletal system     Problem List Patient Active Problem List   Diagnosis Date Noted   Abdominal pain, left lower quadrant 11/13/2020    9:55 AM, 09/06/21 Mearl Latin PT, DPT Physical Therapist at [facility]  [place] [address] [place], [place], [address] Phone: [phone]   Fax:  [phone]  Name: Lekeshia Huestis MRN: [id number] Date of Birth: Mar 11, 1946

## 2021-09-11 ENCOUNTER — Other Ambulatory Visit: Payer: Self-pay

## 2021-09-11 ENCOUNTER — Ambulatory Visit (HOSPITAL_COMMUNITY): Payer: Medicare PPO | Admitting: Physical Therapy

## 2021-09-11 DIAGNOSIS — M542 Cervicalgia: Secondary | ICD-10-CM

## 2021-09-11 DIAGNOSIS — R29898 Other symptoms and signs involving the musculoskeletal system: Secondary | ICD-10-CM

## 2021-09-11 NOTE — Therapy (Signed)
[place], [place], [address] Phone: [phone]   Fax:  [phone]  Physical Therapy Treatment  Patient Details  Name: Lindsay Wilkerson MRN: [id number] Date of Birth: 1946/04/10 Referring Provider (PT): Sanjuana Kava MD   Encounter Date: 09/11/2021   PT End of Session - 09/11/21 1107     Visit Number 8    Number of Visits 12    Date for PT Re-Evaluation 09/19/21    Authorization Type [employer]    Authorization Time Period 12 visits requested 8/17-9/28/22    Authorization - Visit Number 7    Authorization - Number of Visits 12    Progress Note Due on Visit 10    PT Start Time 0836    PT Stop Time 0915    PT Time Calculation (min) 39 min    Activity Tolerance Patient tolerated treatment well    Behavior During Therapy [facility] for tasks assessed/performed             Past Medical History:  Diagnosis Date   Depression, major    Hypertension    Urinary bladder incontinence     Past Surgical History:  Procedure Laterality Date   EYE SURGERY     IR FALLOPIAN TUBE CATHETERIZATION Left    1980   TONSILLECTOMY     Age 75    There were no vitals filed for this visit.   Subjective Assessment - 09/11/21 0840     Subjective Pt states her neck is beginning to improve some with current level of pain 1/10.  STates day before yesterday it was3-4 but much better today.    Currently in Pain? Yes    Pain Score 1     Pain Location Neck    Pain Orientation Right    Pain Descriptors / Indicators Aching                               [facility] Adult PT Treatment/Exercise - 09/11/21 0001       Neck Exercises: Machines for Strengthening   UBE (Upper Arm Bike) 3 minutes backward level 1      Neck Exercises: Standing   Neck Retraction 10 secs;10 reps    Neck Retraction Limitations at wall    Wall Push Ups 10 reps;Limitations    Wall Push Ups Limitations 2 sets      Neck Exercises:  Seated   W Back 20 reps      Neck Exercises: Stretches   Corner Stretch 3 reps;20 seconds      Shoulder Exercises: Standing   Extension Both;10 reps;Theraband    Theraband Level (Shoulder Extension) Level 2 (Red)    Extension Limitations 2 sets    Row Both;Limitations    Theraband Level (Shoulder Row) Level 2 (Red)    Row Limitations 2 sets of 10    Other Standing Exercises bicep curls 2x10 2# with chin retraction      Manual Therapy   Manual Therapy Soft tissue mobilization    Manual therapy comments Manual complete separate than rest of tx    Soft tissue mobilization seated to UT, scalenes and scap mm                       PT Short Term Goals - 08/08/21 1155       PT SHORT TERM GOAL #1   Title Patient will be independent  with HEP in order to improve functional outcomes.    Time 3    Period Weeks    Status New    Target Date 08/29/21      PT SHORT TERM GOAL #2   Title Patient will report at least 25% improvement in symptoms for improved quality of life.    Time 3    Period Weeks    Status New    Target Date 08/29/21               PT Long Term Goals - 08/08/21 1156       PT LONG TERM GOAL #1   Title Patient will report at least 75% improvement in symptoms for improved quality of life.    Time 6    Period Weeks    Status New    Target Date 09/19/21      PT LONG TERM GOAL #2   Title Patient will improve FOTO score by at least 10 points in order to indicate improved tolerance to activity.    Time 6    Period Weeks    Status New    Target Date 09/19/21      PT LONG TERM GOAL #3   Title Patient will demonstrate at least 25% improvement in cervical ROM in all restricted planes for improved ability to move head while at home.    Time 6    Period Weeks    Status New    Target Date 09/19/21                   Plan - 09/11/21 1113     Clinical Impression Statement Began session with warmup on UBE .  continued with postural  strengthening with addition of wall push ups and scap retractions this session.  Corner stretch added to stretch chest mm and promote more upright posturing.  Cues needed when sitting to sit up and not lean back into chair. Continued with manual to cervical mm with multiple spasms reduced in upper trap and scap region that elicited pain.  Pt reported much improvement and being painfree at end of session.    Personal Factors and Comorbidities Age;Comorbidity 3+;Time since onset of injury/illness/exacerbation;Fitness    Comorbidities Anxiety, Arthritis, Asthma, chronic neck, BMI over 30,  Chronic Obstructive Pulmonary Disease, High Blood Pressure    Examination-Activity Limitations Lift;Stand;Bend    Examination-Participation Restrictions Meal Prep;Cleaning;Yard Work;Volunteer;Shop;Laundry;Driving    Stability/Clinical Decision Making Stable/Uncomplicated    Rehab Potential Good    PT Frequency 2x / week    PT Duration 6 weeks    PT Treatment/Interventions ADLs/Self Care Home Management;Aquatic Therapy;Cryotherapy;Electrical Stimulation;Canalith Repostioning;Iontophoresis 4mg /ml Dexamethasone;Moist Heat;Ultrasound;Traction;DME Instruction;Gait training;Stair training;Functional mobility training;Therapeutic activities;Therapeutic exercise;Balance training;Neuromuscular re-education;Patient/family education;Orthotic Fit/Training;Manual techniques;Manual lymph drainage;Compression bandaging;Dry needling;Energy conservation;Scar mobilization;Passive range of motion;Splinting;Taping;Vestibular;Spinal Manipulations;Joint Manipulations    PT Next Visit Plan contiue to progress cervical mobility, postural strengthening and manual for pain/mobility    PT Home Exercise Plan 8/17 cervical retractions in supine; 8/24: seated chin tuck 9/7 decompression with band    Consulted and Agree with Plan of Care Patient             Patient will benefit from skilled therapeutic intervention in order to improve the  following deficits and impairments:  Decreased range of motion, Increased muscle spasms, Decreased endurance, Decreased activity tolerance, Pain, Hypomobility, Impaired flexibility, Improper body mechanics, Decreased mobility, Decreased strength, Postural dysfunction  Visit Diagnosis: Cervicalgia  Other symptoms and signs involving the musculoskeletal system  Problem List Patient Active Problem List   Diagnosis Date Noted   Abdominal pain, left lower quadrant 11/13/2020   Teena Irani, PTA/CLT [phone]  Teena Irani, PTA 09/11/2021, 11:14 AM  [place], [place], [address] Phone: [phone]   Fax:  [phone]  Name: Elke Holtry MRN: [id number] Date of Birth: 1946-06-01

## 2021-09-13 ENCOUNTER — Encounter (HOSPITAL_COMMUNITY): Payer: Self-pay

## 2021-09-13 ENCOUNTER — Ambulatory Visit (HOSPITAL_COMMUNITY): Payer: Medicare PPO

## 2021-09-13 ENCOUNTER — Other Ambulatory Visit: Payer: Self-pay

## 2021-09-13 DIAGNOSIS — M542 Cervicalgia: Secondary | ICD-10-CM | POA: Diagnosis not present

## 2021-09-13 DIAGNOSIS — R29898 Other symptoms and signs involving the musculoskeletal system: Secondary | ICD-10-CM

## 2021-09-13 NOTE — Therapy (Signed)
[place], [place], [address] Phone: [phone]   Fax:  [phone]  Physical Therapy Treatment  Patient Details  Name: Lindsay Wilkerson MRN: [id number] Date of Birth: 20-Jul-1946 Referring Provider (PT): Sanjuana Kava MD   Encounter Date: 09/13/2021   PT End of Session - 09/13/21 0932     Visit Number 9    Number of Visits 12    Date for PT Re-Evaluation 09/19/21    Authorization Type [employer]    Authorization Time Period 12 visits requested 8/17-9/28/22    Authorization - Visit Number 9    Authorization - Number of Visits 12    Progress Note Due on Visit 10    PT Start Time 0924    PT Stop Time 1006    PT Time Calculation (min) 42 min    Activity Tolerance Patient tolerated treatment well    Behavior During Therapy [facility] for tasks assessed/performed             Past Medical History:  Diagnosis Date   Depression, major    Hypertension    Urinary bladder incontinence     Past Surgical History:  Procedure Laterality Date   EYE SURGERY     IR FALLOPIAN TUBE CATHETERIZATION Left    1980   TONSILLECTOMY     Age 75    There were no vitals filed for this visit.   Subjective Assessment - 09/13/21 0928     Subjective Pt stated 3 days without pain medication required.  Reports no pain in neck, just feels tight.  Feels a spams Rt shoulder today    Patient Stated Goals decrease pain    Currently in Pain? Yes    Pain Score 3     Pain Location Shoulder    Pain Orientation Right    Pain Descriptors / Indicators Dull    Pain Type Chronic pain    Pain Onset More than a month ago    Pain Frequency Intermittent    Aggravating Factors  lifting, folding laundry    Pain Relieving Factors heat, ice and tylenol                               [facility] Adult PT Treatment/Exercise - 09/13/21 0001       Neck Exercises: Machines for Strengthening   UBE (Upper Arm Bike) 3 minutes  backward level 1      Neck Exercises: Theraband   Shoulder Extension 10 reps;Red    Rows 10 reps;Red    Rows Limitations 2 sets3      Neck Exercises: Standing   Neck Retraction 10 secs;10 reps    Neck Retraction Limitations at wall    Wall Push Ups 10 reps;Limitations    Wall Push Ups Limitations 5" holds    Other Standing Exercises wall arch with chin tuck      Neck Exercises: Seated   W Back 20 reps    W Back Limitations 5" holds      Neck Exercises: Stretches   Corner Stretch 3 reps;30 seconds      Manual Therapy   Manual Therapy Soft tissue mobilization    Manual therapy comments Manual complete separate than rest of tx    Soft tissue mobilization supine focus on UT, periscapular and scalenes    Manual Traction suboccipital release x 2 min, manual cervical traction x 2 min  PT Short Term Goals - 08/08/21 1155       PT SHORT TERM GOAL #1   Title Patient will be independent with HEP in order to improve functional outcomes.    Time 3    Period Weeks    Status New    Target Date 08/29/21      PT SHORT TERM GOAL #2   Title Patient will report at least 25% improvement in symptoms for improved quality of life.    Time 3    Period Weeks    Status New    Target Date 08/29/21               PT Long Term Goals - 08/08/21 1156       PT LONG TERM GOAL #1   Title Patient will report at least 75% improvement in symptoms for improved quality of life.    Time 6    Period Weeks    Status New    Target Date 09/19/21      PT LONG TERM GOAL #2   Title Patient will improve FOTO score by at least 10 points in order to indicate improved tolerance to activity.    Time 6    Period Weeks    Status New    Target Date 09/19/21      PT LONG TERM GOAL #3   Title Patient will demonstrate at least 25% improvement in cervical ROM in all restricted planes for improved ability to move head while at home.    Time 6    Period Weeks    Status  New    Target Date 09/19/21                   Plan - 09/13/21 1011     Clinical Impression Statement Progressed postural strengthening with additional wall arch with cueing to improve body awareness to reduce forward head and increased lordacic curvature.  EOS with manual to improve cervical mobiltiy and address Rt Ut spasm for pain control.  No reports of pain at EOS.    Personal Factors and Comorbidities Age;Comorbidity 3+;Time since onset of injury/illness/exacerbation;Fitness    Comorbidities Anxiety, Arthritis, Asthma, chronic neck, BMI over 30,  Chronic Obstructive Pulmonary Disease, High Blood Pressure    Examination-Activity Limitations Lift;Stand;Bend    Examination-Participation Restrictions Meal Prep;Cleaning;Yard Work;Volunteer;Shop;Laundry;Driving    Stability/Clinical Decision Making Stable/Uncomplicated    Clinical Decision Making Low    Rehab Potential Good    PT Frequency 2x / week    PT Duration 6 weeks    PT Treatment/Interventions ADLs/Self Care Home Management;Aquatic Therapy;Cryotherapy;Electrical Stimulation;Canalith Repostioning;Iontophoresis 4mg /ml Dexamethasone;Moist Heat;Ultrasound;Traction;DME Instruction;Gait training;Stair training;Functional mobility training;Therapeutic activities;Therapeutic exercise;Balance training;Neuromuscular re-education;Patient/family education;Orthotic Fit/Training;Manual techniques;Manual lymph drainage;Compression bandaging;Dry needling;Energy conservation;Scar mobilization;Passive range of motion;Splinting;Taping;Vestibular;Spinal Manipulations;Joint Manipulations    PT Next Visit Plan 10th visit progress note next session.  contiue to progress cervical mobility, postural strengthening and manual for pain/mobility    PT Home Exercise Plan 8/17 cervical retractions in supine; 8/24: seated chin tuck 9/7 decompression with band    Consulted and Agree with Plan of Care Patient             Patient will benefit from skilled  therapeutic intervention in order to improve the following deficits and impairments:  Decreased range of motion, Increased muscle spasms, Decreased endurance, Decreased activity tolerance, Pain, Hypomobility, Impaired flexibility, Improper body mechanics, Decreased mobility, Decreased strength, Postural dysfunction  Visit Diagnosis: Other symptoms and signs involving the musculoskeletal system  Cervicalgia  Problem List Patient Active Problem List   Diagnosis Date Noted   Abdominal pain, left lower quadrant 11/13/2020   Ihor Austin, LPTA/CLT; CBIS [phone] Aldona Lento, [place] 09/13/2021, 10:18 AM  [place], [place], [address] Phone: [phone]   Fax:  [phone]  Name: Lindsay Wilkerson MRN: [id number] Date of Birth: 1946/04/13

## 2021-09-17 ENCOUNTER — Ambulatory Visit: Payer: Medicare PPO | Admitting: Podiatry

## 2021-09-17 ENCOUNTER — Other Ambulatory Visit: Payer: Self-pay

## 2021-09-17 DIAGNOSIS — M722 Plantar fascial fibromatosis: Secondary | ICD-10-CM

## 2021-09-19 ENCOUNTER — Encounter (HOSPITAL_COMMUNITY): Payer: Self-pay | Admitting: Physical Therapy

## 2021-09-19 ENCOUNTER — Other Ambulatory Visit: Payer: Self-pay

## 2021-09-19 ENCOUNTER — Ambulatory Visit (HOSPITAL_COMMUNITY): Payer: Medicare PPO | Admitting: Physical Therapy

## 2021-09-19 DIAGNOSIS — M542 Cervicalgia: Secondary | ICD-10-CM | POA: Diagnosis not present

## 2021-09-19 DIAGNOSIS — R29898 Other symptoms and signs involving the musculoskeletal system: Secondary | ICD-10-CM

## 2021-09-19 NOTE — Therapy (Signed)
[place], [place], [address] Phone: [phone]   Fax:  [phone]  Physical Therapy Treatment/Discharge Summary  Patient Details  Name: Lindsay Wilkerson MRN: [id number] Date of Birth: 06-30-1946 Referring Provider (PT): Sanjuana Kava MD   Encounter Date: 09/19/2021  PHYSICAL THERAPY DISCHARGE SUMMARY  Visits from Start of Care: 10  Current functional level related to goals / functional outcomes: See below   Remaining deficits: See below   Education / Equipment: See below   Patient agrees to discharge. Patient goals were met. Patient is being discharged due to meeting the stated rehab goals.    PT End of Session - 09/19/21 0918     Visit Number 10    Number of Visits 12    Date for PT Re-Evaluation 09/19/21    Authorization Type [employer]    Authorization Time Period 12 visits requested 8/17-9/28/22    Authorization - Visit Number 10    Authorization - Number of Visits 12    Progress Note Due on Visit 10    PT Start Time 0917    PT Stop Time 0938    PT Time Calculation (min) 21 min    Activity Tolerance Patient tolerated treatment well    Behavior During Therapy WFL for tasks assessed/performed             Past Medical History:  Diagnosis Date   Depression, major    Hypertension    Urinary bladder incontinence     Past Surgical History:  Procedure Laterality Date   EYE SURGERY     IR FALLOPIAN TUBE CATHETERIZATION Left    1980   TONSILLECTOMY     Age 75    There were no vitals filed for this visit.   Subjective Assessment - 09/19/21 0918     Subjective Patient states she has been feeling fine. Some good days and some not so good days. Her home exercises are going well. Patient states 75-80% improvement with PT intervention. She continues to have increased symptoms with cores. She feels she is able to manage symptoms.    Patient Stated Goals decrease pain    Currently in  Pain? No/denies    Pain Onset More than a month ago                [facility] PT Assessment - 09/19/21 0001       Assessment   Medical Diagnosis Chronic Neck pain    Referring Provider (PT) Sanjuana Kava MD    Onset Date/Surgical Date 08/08/20      Precautions   Precautions None      Restrictions   Weight Bearing Restrictions No      Balance Screen   Has the patient fallen in the past 6 months No    Has the patient had a decrease in activity level because of a fear of falling?  No    Is the patient reluctant to leave their home because of a fear of falling?  No      Prior Function   Level of Independence Independent      Cognition   Overall Cognitive Status Within Functional Limits for tasks assessed      Observation/Other Assessments   Observations slouched in seated, ambulates without AD    Focus on Therapeutic Outcomes (FOTO)  67% function      AROM   Cervical Flexion 0% limited    Cervical Extension 0% limited    Cervical -  Right Side Bend 25% limited    Cervical - Left Side Bend 0% limited    Cervical - Right Rotation 0% limited    Cervical - Left Rotation 0% limited      Strength   Right Shoulder ABduction 4+/5    Left Shoulder ABduction 5/5    Right Elbow Flexion 5/5    Right Elbow Extension 5/5    Left Elbow Flexion 5/5    Left Elbow Extension 5/5                                    PT Education - 09/19/21 0918     Education Details HEP, reassessment findings    Person(s) Educated Patient    Methods Explanation    Comprehension Verbalized understanding              PT Short Term Goals - 09/19/21 0938       PT SHORT TERM GOAL #1   Title Patient will be independent with HEP in order to improve functional outcomes.    Time 3    Period Weeks    Status Achieved    Target Date 08/29/21      PT SHORT TERM GOAL #2   Title Patient will report at least 25% improvement in symptoms for improved quality of life.    Time  3    Period Weeks    Status Achieved    Target Date 08/29/21               PT Long Term Goals - 09/19/21 0923       PT LONG TERM GOAL #1   Title Patient will report at least 75% improvement in symptoms for improved quality of life.    Time 6    Period Weeks    Status Achieved      PT LONG TERM GOAL #2   Title Patient will improve FOTO score by at least 10 points in order to indicate improved tolerance to activity.    Time 6    Period Weeks    Status Partially Met      PT LONG TERM GOAL #3   Title Patient will demonstrate at least 25% improvement in cervical ROM in all restricted planes for improved ability to move head while at home.    Time 6    Period Weeks    Status Achieved                   Plan - 09/19/21 1829     Clinical Impression Statement Patient has met all short and long term goals with improvement in symptoms and improvement in ROM, activity tolerance, and functional mobility. She remains limited by intermittent cervical and R shoulder symptoms. Reviewed HEP with patient and educated her on returning to therapy if needed. Patient discharged from physical therapy at this time.    Personal Factors and Comorbidities Age;Comorbidity 3+;Time since onset of injury/illness/exacerbation;Fitness    Comorbidities Anxiety, Arthritis, Asthma, chronic neck, BMI over 30,  Chronic Obstructive Pulmonary Disease, High Blood Pressure    Examination-Activity Limitations Lift;Stand;Bend    Examination-Participation Restrictions Meal Prep;Cleaning;Yard Work;Volunteer;Shop;Laundry;Driving    Stability/Clinical Decision Making Stable/Uncomplicated    Rehab Potential Good    PT Frequency 2x / week    PT Duration 6 weeks    PT Treatment/Interventions ADLs/Self Care Home Management;Aquatic Therapy;Cryotherapy;Electrical Stimulation;Canalith Repostioning;Iontophoresis 31m/ml Dexamethasone;Moist Heat;Ultrasound;Traction;DME Instruction;Gait training;Stair training;Functional  mobility training;Therapeutic activities;Therapeutic exercise;Balance training;Neuromuscular re-education;Patient/family education;Orthotic Fit/Training;Manual techniques;Manual lymph drainage;Compression bandaging;Dry needling;Energy conservation;Scar mobilization;Passive range of motion;Splinting;Taping;Vestibular;Spinal Manipulations;Joint Manipulations    PT Next Visit Plan 10th visit progress note next session.  contiue to progress cervical mobility, postural strengthening and manual for pain/mobility    PT Home Exercise Plan 8/17 cervical retractions in supine; 8/24: seated chin tuck 9/7 decompression with band    Consulted and Agree with Plan of Care Patient             Patient will benefit from skilled therapeutic intervention in order to improve the following deficits and impairments:  Decreased range of motion, Increased muscle spasms, Decreased endurance, Decreased activity tolerance, Pain, Hypomobility, Impaired flexibility, Improper body mechanics, Decreased mobility, Decreased strength, Postural dysfunction  Visit Diagnosis: Other symptoms and signs involving the musculoskeletal system  Cervicalgia     Problem List Patient Active Problem List   Diagnosis Date Noted   Abdominal pain, left lower quadrant 11/13/2020   9:46 AM, 09/19/21 Mearl Latin PT, DPT Physical Therapist at [place], [place], [address] Phone: [phone]   Fax:  [phone]  Name: Lindsay Wilkerson MRN: [id number] Date of Birth: 09/23/1946

## 2021-09-21 ENCOUNTER — Encounter (HOSPITAL_COMMUNITY): Payer: Medicare PPO

## 2021-09-21 NOTE — Progress Notes (Signed)
  Subjective:  Patient ID: Lindsay Wilkerson, female    DOB: 1946/08/18,  MRN: [id number]  Chief Complaint  Patient presents with   Plantar Fasciitis    6 week follow up, bilateral    75 y.o. female presents with the above complaint. History confirmed with patient.  Has had some improvement but still some pain.  She is currently physical therapy for her neck and shoulder.  Objective:  Physical Exam: warm, good capillary refill, no trophic changes or ulcerative lesions, normal DP and PT pulses, and normal sensory exam. Left Foot: point tenderness over the heel pad and point tenderness of the mid plantar fascia Right Foot: point tenderness over the heel pad and point tenderness of the mid plantar fascia  No images are attached to the encounter.  Radiographs: Multiple views x-ray of both feet: no fracture, dislocation, swelling or degenerative changes noted and plantar calcaneal spur larger on the right side Assessment:   1. Plantar fasciitis, bilateral      Plan:  Patient was evaluated and treated and all questions answered.  Discussed the etiology and treatment options for plantar fasciitis including stretching, formal physical therapy, supportive shoegears such as a running shoe or sneaker, pre fabricated orthoses, injection therapy, and oral medications. We also discussed the role of surgical treatment of this for patients who do not improve after exhausting non-surgical treatment options.   -Continue stretching and icing of the affected limb -Continue wearing plantar fascial brace -Referral sent to outpatient rehab and [facility]   Return in about 8 weeks (around 11/12/2021).

## 2021-09-27 ENCOUNTER — Ambulatory Visit: Payer: Medicare PPO | Admitting: Orthopaedic Surgery

## 2021-09-27 ENCOUNTER — Encounter: Payer: Self-pay | Admitting: Orthopaedic Surgery

## 2021-09-27 ENCOUNTER — Other Ambulatory Visit: Payer: Self-pay

## 2021-09-27 ENCOUNTER — Ambulatory Visit: Payer: Medicare PPO

## 2021-09-27 VITALS — BP 129/60 | HR 53 | Ht 68.0 in | Wt 216.6 lb

## 2021-09-27 DIAGNOSIS — M25511 Pain in right shoulder: Secondary | ICD-10-CM | POA: Diagnosis not present

## 2021-09-27 DIAGNOSIS — G8929 Other chronic pain: Secondary | ICD-10-CM

## 2021-09-27 DIAGNOSIS — M5410 Radiculopathy, site unspecified: Secondary | ICD-10-CM | POA: Diagnosis not present

## 2021-09-27 NOTE — Progress Notes (Signed)
My neck is better but my right shoulder hurts.  She has been to PT and has been discharged for her neck pain.  Her neck pain is much less.  I have reviewed the PT notes.  She has a new problem, right shoulder pain that hurts in certain motions.  She can raise overhead but it hurts to lay on it at night and in some lateral movements.  She has no trauma, no swelling, no numbness.  Neck has full ROM.  Right shoulder has full ROM but pain in the extremes.  NV intact.  Grips are normal.  X-rays were done of the right shoulder, reported separately.  Encounter Diagnoses  Name Primary?   Chronic right shoulder pain Yes   Radicular pain of upper extremity    PROCEDURE NOTE:  The patient request injection, verbal consent was obtained.  The right shoulder was prepped appropriately after time out was performed.   Sterile technique was observed and injection of 1 cc of Celestone 6 mg with several cc's of plain xylocaine. Anesthesia was provided by ethyl chloride and a 20-gauge needle was used to inject the shoulder area. A posterior approach was used.  The injection was tolerated well.  A band aid dressing was applied.  The patient was advised to apply ice later today and tomorrow to the injection sight as needed.  Return in one month.  Call if any problem.  Precautions discussed.  Electronically Signed Sanjuana Kava, MD 10/6/20229:00 AM

## 2021-10-02 ENCOUNTER — Ambulatory Visit (HOSPITAL_COMMUNITY): Payer: Medicare PPO | Admitting: Physical Therapy

## 2021-10-25 ENCOUNTER — Ambulatory Visit: Payer: Medicare PPO | Admitting: Orthopaedic Surgery

## 2021-10-30 ENCOUNTER — Ambulatory Visit: Payer: Medicare PPO | Admitting: Orthopaedic Surgery

## 2021-11-01 ENCOUNTER — Telehealth: Payer: Self-pay | Admitting: Orthopaedic Surgery

## 2021-11-01 NOTE — Telephone Encounter (Signed)
Patient called to request phone number of neurosurgery office where she had been referred - said was seen there once. Phone number given.

## 2021-11-12 ENCOUNTER — Ambulatory Visit: Payer: Medicare PPO | Admitting: Podiatry

## 2021-11-26 DIAGNOSIS — K5790 Diverticulosis of intestine, part unspecified, without perforation or abscess without bleeding: Secondary | ICD-10-CM | POA: Diagnosis not present

## 2021-11-26 DIAGNOSIS — R1032 Left lower quadrant pain: Secondary | ICD-10-CM | POA: Diagnosis not present

## 2021-11-26 DIAGNOSIS — K219 Gastro-esophageal reflux disease without esophagitis: Secondary | ICD-10-CM | POA: Diagnosis not present

## 2021-11-26 DIAGNOSIS — R7303 Prediabetes: Secondary | ICD-10-CM | POA: Diagnosis not present

## 2021-11-26 DIAGNOSIS — I10 Essential (primary) hypertension: Secondary | ICD-10-CM | POA: Diagnosis not present

## 2021-11-27 ENCOUNTER — Other Ambulatory Visit: Payer: Self-pay | Admitting: Gerontology

## 2021-12-22 ENCOUNTER — Other Ambulatory Visit: Payer: Self-pay | Admitting: Family Medicine

## 2021-12-22 ENCOUNTER — Other Ambulatory Visit: Payer: Self-pay

## 2021-12-22 ENCOUNTER — Ambulatory Visit
Admission: EM | Admit: 2021-12-22 | Discharge: 2021-12-22 | Disposition: A | Discharge status: Home / Self Care | Payer: Medicare PPO | Attending: Family Medicine | Admitting: Family Medicine

## 2021-12-22 DIAGNOSIS — B029 Zoster without complications: Secondary | ICD-10-CM

## 2021-12-22 DIAGNOSIS — J069 Acute upper respiratory infection, unspecified: Secondary | ICD-10-CM

## 2021-12-22 DIAGNOSIS — J441 Chronic obstructive pulmonary disease with (acute) exacerbation: Secondary | ICD-10-CM | POA: Diagnosis not present

## 2021-12-22 MED ORDER — ALBUTEROL SULFATE HFA 108 (90 BASE) MCG/ACT IN AERS
2.0000 | INHALATION_SPRAY | Freq: Four times a day (QID) | RESPIRATORY_TRACT | 0 refills | Status: DC | PRN
Start: 2021-12-22 — End: 2023-04-16

## 2021-12-22 MED ORDER — VALACYCLOVIR HCL 1 G PO TABS: 1000.0000 mg | ORAL_TABLET | Freq: Two times a day (BID) | ORAL | 0 refills | Status: DC

## 2021-12-22 MED ORDER — DOXYCYCLINE HYCLATE 100 MG PO CAPS: 100.0000 mg | ORAL_CAPSULE | Freq: Two times a day (BID) | ORAL | 0 refills | Status: DC

## 2021-12-22 MED ORDER — BECLOMETHASONE DIPROPIONATE 80 MCG/ACT IN AERS: 1.0000 | INHALATION_SPRAY | Freq: Two times a day (BID) | RESPIRATORY_TRACT | 0 refills | Status: DC

## 2021-12-22 MED ORDER — PREDNISONE 20 MG PO TABS: 40.0000 mg | ORAL_TABLET | Freq: Every day | ORAL | 0 refills | Status: DC

## 2021-12-22 NOTE — ED Provider Notes (Signed)
[facility]    CSN: [id number] Arrival date & time: 12/22/21  1210      History   Chief Complaint Chief Complaint  Patient presents with   Rash    Rash on right side of chest, cough and pain    HPI Lindsay Wilkerson is a 75 y.o. female.   Patient presenting today with 4-day history of initially body aches, chills, sweats but now just productive cough, wheezing, chest tightness, nasal congestion, fatigue.  She denies abdominal pain, nausea, vomiting, diarrhea, sore throat, chest pain.  So far taking her inhaler regimen, cough medication with minimal relief.  History of COPD on Qvar, albuterol as needed but states she is almost out of her Qvar and has been out of albuterol for quite some time.  No known sick contacts recently.  She is also having a painful rash to the right chest wall that is blistering.  She thinks that she may have shingles.  Not trying anything for this.   Past Medical History:  Diagnosis Date   Depression, major    Hypertension    Urinary bladder incontinence     Patient Active Problem List   Diagnosis Date Noted   Abdominal pain, left lower quadrant 11/13/2020    Past Surgical History:  Procedure Laterality Date   EYE SURGERY     IR FALLOPIAN TUBE CATHETERIZATION Left    1980   TONSILLECTOMY     Age 35    OB History     Gravida  3   Para      Term      Preterm      AB  3   Living  0      SAB      IAB      Ectopic      Multiple      Live Births               Home Medications    Prior to Admission medications   Medication Sig Start Date End Date Taking? Authorizing Provider  doxycycline (VIBRAMYCIN) 100 MG capsule Take 1 capsule (100 mg total) by mouth 2 (two) times daily. 12/22/21  Yes Volney American, PA-C  predniSONE (DELTASONE) 20 MG tablet Take 2 tablets (40 mg total) by mouth daily with breakfast. 12/22/21  Yes Volney American, PA-C  valACYclovir (VALTREX) 1000 MG tablet Take 1 tablet  (1,000 mg total) by mouth 2 (two) times daily. 12/22/21  Yes Volney American, PA-C  albuterol (VENTOLIN HFA) 108 (90 Base) MCG/ACT inhaler Inhale 2 puffs into the lungs every 6 (six) hours as needed for wheezing or shortness of breath. 12/22/21   Volney American, PA-C  atenolol-chlorthalidone (TENORETIC) 50-25 MG per tablet Take 1 tablet by mouth daily.      [provider]  azelastine (ASTELIN) 0.1 % nasal spray Place into both nostrils 2 (two) times daily. Use in each nostril as directed    [provider]  beclomethasone (QVAR) 80 MCG/ACT inhaler Inhale 1 puff into the lungs 2 (two) times daily. 12/22/21   Volney American, PA-C  Bromfenac Sodium 0.09 % SOLN Apply 1 drop to eye daily.    [provider]  Calcium Carb-Cholecalciferol (OYSTER SHELL CALCIUM) 500-400 MG-UNIT TABS Take by mouth.    [provider]  CLONAZEPAM PO Take 0.5 tablets by mouth daily as needed. FOR ANXIETY     [provider]  fluticasone (FLONASE) 50 MCG/ACT nasal spray Place 2 sprays  into both nostrils daily.    [provider]  loratadine (CLARITIN) 10 MG tablet Take 10 mg by mouth daily.    [provider]  Multiple Vitamins-Minerals (MULTIVITAMIN WITH MINERALS) tablet Take 1 tablet by mouth daily.    [provider]  Omeprazole (PRILOSEC PO) Take 1 capsule by mouth daily. 40 mg    [provider]  potassium chloride SA (KLOR-CON) 20 MEQ tablet Take 20 mEq by mouth 2 (two) times daily.    [provider]  Tolterodine Tartrate (DETROL PO) Take 1 tablet by mouth daily.    [provider]    Family History Family History  Problem Relation Age of Onset   Hypertension Mother    Heart disease Mother    Heart disease Father    Hypertension Sister    Kidney disease Sister    Breast cancer Sister    Rheum arthritis Sister    Heart disease Brother    Hypertension Brother     Social History Social  History   Tobacco Use   Smoking status: Former   Smokeless tobacco: Never  Scientific laboratory technician Use: Never used  Substance Use Topics   Alcohol use: Yes    Comment: occasionally   Drug use: No     Allergies   Amoxicillin, Augmentin [amoxicillin-pot clavulanate], Erythromycin, Sulfa antibiotics, Sulfasalazine, Tetanus toxoids, and Tussionex pennkinetic er [hydrocod polst-cpm polst er]   Review of Systems Review of Systems Per HPI  Physical Exam Triage Vital Signs ED Triage Vitals  Enc Vitals Group     BP 12/22/21 1524 132/67     Pulse Rate 12/22/21 1524 70     Resp 12/22/21 1524 16     Temp 12/22/21 1524 99.1 F (37.3 C)     Temp Source 12/22/21 1524 Oral     SpO2 12/22/21 1524 96 %     Weight --      Height --      Head Circumference --      Peak Flow --      Pain Score 12/22/21 1519 4     Pain Loc --      Pain Edu? --      Excl. in [employer]? --    No data found.  Updated Vital Signs BP 132/67 (BP Location: Right Arm)    Pulse 70    Temp 99.1 F (37.3 C) (Oral)    Resp 16    SpO2 96%   Visual Acuity Right Eye Distance:   Left Eye Distance:   Bilateral Distance:    Right Eye Near:   Left Eye Near:    Bilateral Near:     Physical Exam Vitals and nursing note reviewed.  Constitutional:      Appearance: Normal appearance. She is not ill-appearing.  HENT:     Head: Atraumatic.     Right Ear: Tympanic membrane and external ear normal.     Left Ear: Tympanic membrane and external ear normal.     Nose: Rhinorrhea present.     Mouth/Throat:     Mouth: Mucous membranes are moist.     Pharynx: Posterior oropharyngeal erythema present.  Eyes:     Extraocular Movements: Extraocular movements intact.     Conjunctiva/sclera: Conjunctivae normal.  Cardiovascular:     Rate and Rhythm: Normal rate and regular rhythm.     Heart sounds: Normal heart sounds.  Pulmonary:     Effort: Pulmonary effort is normal.     Breath  sounds: No wheezing or rales.  Musculoskeletal:         General: Normal range of motion.     Cervical back: Normal range of motion and neck supple.  Skin:    General: Skin is warm.     Findings: Rash present.     Comments: Erythematous vesicular rash in a linear pattern from midline right chest wall extending out laterally toward breast  Neurological:     Mental Status: She is alert and oriented to person, place, and time.  Psychiatric:        Mood and Affect: Mood normal.        Thought Content: Thought content normal.        Judgment: Judgment normal.     UC Treatments / Results  Labs (all labs ordered are listed, but only abnormal results are displayed) Labs Reviewed - No data to display  EKG   Radiology No results found.  Procedures Procedures (including critical care time)  Medications Ordered in UC Medications - No data to display  Initial Impression / Assessment and Plan / UC Course  I have reviewed the triage vital signs and the nursing notes.  Pertinent labs & imaging results that were available during my care of the patient were reviewed by me and considered in my medical decision making (see chart for details).      Suspect initially viral upper respiratory infection now into COPD exacerbation, will treat with doxycycline, prednisone and refill Qvar, albuterol inhaler.  Discussed supportive medications and home care.  Rashes consistent with shingles, will treat with Valtrex in addition to the prednisone already sent for COPD exacerbation.  Return for acutely worsening symptoms  Final Clinical Impressions(s) / UC Diagnoses   Final diagnoses:  Viral URI with cough  COPD exacerbation (HCC)  Herpes zoster without complication   Discharge Instructions   None    ED Prescriptions     Medication Sig Dispense Auth. Provider   beclomethasone (QVAR) 80 MCG/ACT inhaler Inhale 1 puff into the lungs 2 (two) times daily. 1 each Volney American, PA-C   albuterol (VENTOLIN HFA) 108 (90 Base) MCG/ACT  inhaler Inhale 2 puffs into the lungs every 6 (six) hours as needed for wheezing or shortness of breath. 18 g Volney American, [place]   valACYclovir (VALTREX) 1000 MG tablet Take 1 tablet (1,000 mg total) by mouth 2 (two) times daily. 14 tablet Volney American, [place]   predniSONE (DELTASONE) 20 MG tablet Take 2 tablets (40 mg total) by mouth daily with breakfast. 10 tablet Volney American, PA-C   doxycycline (VIBRAMYCIN) 100 MG capsule Take 1 capsule (100 mg total) by mouth 2 (two) times daily. 14 capsule Volney American, [place]      PDMP not reviewed this encounter.   Volney American, [place] 12/22/21 1547

## 2021-12-22 NOTE — ED Triage Notes (Signed)
Patient states that on Thursday she started cough and now she has mucus.  Patient states she is now having SOB and tightness in her back.    Patient states she is diagnosed with COPD.  She states as she was dressing today she found a rash on right breast and into the chest area. She states there is shooting pain across the breast into the chest.   She states she may have shingles

## 2021-12-23 MED ORDER — FLUTICASONE-SALMETEROL 250-50 MCG/ACT IN AEPB: 1.0000 | INHALATION_SPRAY | Freq: Two times a day (BID) | RESPIRATORY_TRACT | 0 refills | Status: DC

## 2021-12-23 NOTE — Telephone Encounter (Signed)
Change of Inhaler sent due to coverage of prior script

## 2021-12-27 DIAGNOSIS — F325 Major depressive disorder, single episode, in full remission: Secondary | ICD-10-CM | POA: Diagnosis not present

## 2021-12-27 DIAGNOSIS — I10 Essential (primary) hypertension: Secondary | ICD-10-CM | POA: Diagnosis not present

## 2021-12-27 DIAGNOSIS — E669 Obesity, unspecified: Secondary | ICD-10-CM | POA: Diagnosis not present

## 2021-12-27 DIAGNOSIS — J301 Allergic rhinitis due to pollen: Secondary | ICD-10-CM | POA: Diagnosis not present

## 2021-12-27 DIAGNOSIS — B029 Zoster without complications: Secondary | ICD-10-CM | POA: Diagnosis not present

## 2021-12-27 DIAGNOSIS — K219 Gastro-esophageal reflux disease without esophagitis: Secondary | ICD-10-CM | POA: Diagnosis not present

## 2021-12-27 DIAGNOSIS — G8929 Other chronic pain: Secondary | ICD-10-CM | POA: Diagnosis not present

## 2021-12-27 DIAGNOSIS — J449 Chronic obstructive pulmonary disease, unspecified: Secondary | ICD-10-CM | POA: Diagnosis not present

## 2021-12-27 DIAGNOSIS — F411 Generalized anxiety disorder: Secondary | ICD-10-CM | POA: Diagnosis not present

## 2021-12-31 DIAGNOSIS — J440 Chronic obstructive pulmonary disease with acute lower respiratory infection: Secondary | ICD-10-CM | POA: Diagnosis not present

## 2021-12-31 DIAGNOSIS — I10 Essential (primary) hypertension: Secondary | ICD-10-CM | POA: Diagnosis not present

## 2021-12-31 DIAGNOSIS — B0223 Postherpetic polyneuropathy: Secondary | ICD-10-CM | POA: Diagnosis not present

## 2022-01-06 ENCOUNTER — Ambulatory Visit
Admission: RE | Admit: 2022-01-06 | Discharge: 2022-01-06 | Disposition: A | Discharge status: Home / Self Care | Payer: Medicare PPO | Source: Ambulatory Visit

## 2022-01-06 ENCOUNTER — Other Ambulatory Visit: Payer: Self-pay

## 2022-01-06 VITALS — BP 137/63 | HR 84 | Temp 98.6°F | Resp 16

## 2022-01-06 DIAGNOSIS — B029 Zoster without complications: Secondary | ICD-10-CM

## 2022-01-06 MED ORDER — PREDNISONE 20 MG PO TABS
40.0000 mg | ORAL_TABLET | Freq: Every day | ORAL | 0 refills | Status: DC
Start: 2022-01-06 — End: 2022-05-23

## 2022-01-06 NOTE — ED Provider Notes (Signed)
[facility]    CSN: [id number] Arrival date & time: 01/06/22  0959      History   Chief Complaint Chief Complaint  Patient presents with   Herpes Zoster    Still in pain from the shingles    HPI Lindsay Wilkerson is a 76 y.o. female.   Presenting today following up on herpes zoster of the right flank diagnosis from 12/22/2021.  She was treated with Valtrex, prednisone at this time and states that she is still having stabbing shooting pains in that area and more superficial burning sensations now that the rash is drying up.  She was seen by her primary care provider several days ago and given ibuprofen, low-dose gabapentin which she states is not making much of a difference.  She called the nurse line and was advised to go to urgent care to see if anything else could be done.  Denies fever, chills, new lesions.   Past Medical History:  Diagnosis Date   Depression, major    Hypertension    Urinary bladder incontinence     Patient Active Problem List   Diagnosis Date Noted   Abdominal pain, left lower quadrant 11/13/2020    Past Surgical History:  Procedure Laterality Date   EYE SURGERY     IR FALLOPIAN TUBE CATHETERIZATION Left    1980   TONSILLECTOMY     Age 21    OB History     Gravida  3   Para      Term      Preterm      AB  3   Living  0      SAB      IAB      Ectopic      Multiple      Live Births               Home Medications    Prior to Admission medications   Medication Sig Start Date End Date Taking? Authorizing Provider  albuterol (VENTOLIN HFA) 108 (90 Base) MCG/ACT inhaler Inhale 2 puffs into the lungs every 6 (six) hours as needed for wheezing or shortness of breath. 12/22/21   Volney American, PA-C  atenolol-chlorthalidone (TENORETIC) 50-25 MG per tablet Take 1 tablet by mouth daily.      [provider]  azelastine (ASTELIN) 0.1 % nasal spray Place into both nostrils 2 (two) times daily. Use in  each nostril as directed    [provider]  beclomethasone (QVAR) 80 MCG/ACT inhaler Inhale 1 puff into the lungs 2 (two) times daily. 12/22/21   Volney American, PA-C  Bromfenac Sodium 0.09 % SOLN Apply 1 drop to eye daily.    [provider]  Calcium Carb-Cholecalciferol (OYSTER SHELL CALCIUM) 500-400 MG-UNIT TABS Take by mouth.    [provider]  CLONAZEPAM PO Take 0.5 tablets by mouth daily as needed. FOR ANXIETY     [provider]  doxycycline (VIBRAMYCIN) 100 MG capsule Take 1 capsule (100 mg total) by mouth 2 (two) times daily. 12/22/21   Volney American, PA-C  fluticasone [facility]) 50 MCG/ACT nasal spray Place 2 sprays into both nostrils daily.    [provider]  fluticasone-salmeterol (ADVAIR) 250-50 MCG/ACT AEPB Inhale 1 puff into the lungs in the morning and at bedtime. 12/23/21   Volney American, PA-C  loratadine (CLARITIN) 10 MG tablet Take 10 mg by mouth daily.    [provider]  Multiple Vitamins-Minerals (MULTIVITAMIN WITH  MINERALS) tablet Take 1 tablet by mouth daily.    [provider]  Omeprazole (PRILOSEC PO) Take 1 capsule by mouth daily. 40 mg    [provider]  potassium chloride SA (KLOR-CON) 20 MEQ tablet Take 20 mEq by mouth 2 (two) times daily.    [provider]  predniSONE (DELTASONE) 20 MG tablet Take 2 tablets (40 mg total) by mouth daily with breakfast. 01/06/22   Volney American, PA-C  Tolterodine Tartrate (DETROL PO) Take 1 tablet by mouth daily.    [provider]  TRELEGY ELLIPTA 100-62.5-25 MCG/ACT AEPB Inhale 1 puff into the lungs daily. 01/03/22   [provider]  valACYclovir (VALTREX) 1000 MG tablet Take 1 tablet (1,000 mg total) by mouth 2 (two) times daily. 12/22/21   Volney American, PA-C    Family History Family History  Problem Relation Age of Onset   Hypertension Mother    Heart disease Mother    Heart disease  Father    Hypertension Sister    Kidney disease Sister    Breast cancer Sister    Rheum arthritis Sister    Heart disease Brother    Hypertension Brother     Social History Social History   Tobacco Use   Smoking status: Former   Smokeless tobacco: Never  Scientific laboratory technician Use: Never used  Substance Use Topics   Alcohol use: Yes    Comment: occasionally   Drug use: No     Allergies   Amoxicillin, Augmentin [amoxicillin-pot clavulanate], Erythromycin, Sulfa antibiotics, Sulfasalazine, Tetanus toxoids, and Tussionex pennkinetic er [hydrocod polst-cpm polst er]   Review of Systems Review of Systems Per HPI  Physical Exam Triage Vital Signs ED Triage Vitals  Enc Vitals Group     BP 01/06/22 1013 137/63     Pulse Rate 01/06/22 1013 84     Resp 01/06/22 1013 16     Temp 01/06/22 1013 98.6 F (37 C)     Temp Source 01/06/22 1013 Oral     SpO2 01/06/22 1013 95 %     Weight --      Height --      Head Circumference --      Peak Flow --      Pain Score 01/06/22 1018 9     Pain Loc --      Pain Edu? --      Excl. in [place]? --    No data found.  Updated Vital Signs BP 137/63 (BP Location: Left Arm)    Pulse 84    Temp 98.6 F (37 C) (Oral)    Resp 16    SpO2 95%   Visual Acuity Right Eye Distance:   Left Eye Distance:   Bilateral Distance:    Right Eye Near:   Left Eye Near:    Bilateral Near:     Physical Exam Vitals and nursing note reviewed.  Constitutional:      Appearance: Normal appearance. She is not ill-appearing.  HENT:     Head: Atraumatic.  Eyes:     Extraocular Movements: Extraocular movements intact.     Conjunctiva/sclera: Conjunctivae normal.  Cardiovascular:     Rate and Rhythm: Normal rate and regular rhythm.     Heart sounds: Normal heart sounds.  Pulmonary:     Effort: Pulmonary effort is normal.     Breath sounds: Normal breath sounds.  Musculoskeletal:        General: Normal range of motion.  Cervical back: Normal range of  motion and neck supple.  Skin:    General: Skin is warm and dry.  Neurological:     Mental Status: She is alert and oriented to person, place, and time.  Psychiatric:        Mood and Affect: Mood normal.        Thought Content: Thought content normal.        Judgment: Judgment normal.     UC Treatments / Results  Labs (all labs ordered are listed, but only abnormal results are displayed) Labs Reviewed - No data to display  EKG   Radiology No results found.  Procedures Procedures (including critical care time)  Medications Ordered in UC Medications - No data to display  Initial Impression / Assessment and Plan / UC Course  I have reviewed the triage vital signs and the nursing notes.  Pertinent labs & imaging results that were available during my care of the patient were reviewed by me and considered in my medical decision making (see chart for details).     Provide 1 more burst of prednisone to see if this helps expedite her healing process as she has been suffering with severe pain from her shingles rash for 2 weeks now.  Continue the gabapentin, ibuprofen and follow-up with PCP for recheck.  Final Clinical Impressions(s) / UC Diagnoses   Final diagnoses:  Herpes zoster without complication   Discharge Instructions   None    ED Prescriptions     Medication Sig Dispense Auth. Provider   predniSONE (DELTASONE) 20 MG tablet Take 2 tablets (40 mg total) by mouth daily with breakfast. 10 tablet Volney American, [place]      PDMP not reviewed this encounter.   Volney American, [place] 01/06/22 1454

## 2022-01-06 NOTE — ED Triage Notes (Signed)
Patient states she is still in pain from her shingle outbreak on 12/22/2021.   Patient states she went to see her PCP on Monday and they gave her Ibuprofen 800mg  and Gabapentin 100mg  and Dermoplast.   Patient states she is taking cool shower and using cool wash clothes.   Patient states that some of the shingles have dried out and they are burning

## 2022-01-13 ENCOUNTER — Other Ambulatory Visit: Payer: Self-pay | Admitting: Family Medicine

## 2022-01-13 NOTE — Telephone Encounter (Signed)
Seen at [facility] 12/22/21 pt no longer under prescriber's care please call PCP  Requested Prescriptions  Refused Prescriptions Disp Refills   albuterol (VENTOLIN HFA) 108 (90 Base) MCG/ACT inhaler [Pharmacy Med Name: ALBUTEROL HFA (VENTOLIN) INH] 18 each     Sig: TAKE 2 PUFFS BY MOUTH EVERY 6 HOURS AS NEEDED FOR WHEEZE OR SHORTNESS OF BREATH     Pulmonology:  Beta Agonists Failed - 01/13/2022  9:57 AM      Failed - One inhaler should last at least one month. If the patient is requesting refills earlier, contact the patient to check for uncontrolled symptoms.      Failed - Valid encounter within last 12 months    Recent Outpatient Visits   None

## 2022-01-31 DIAGNOSIS — J45909 Unspecified asthma, uncomplicated: Secondary | ICD-10-CM | POA: Diagnosis not present

## 2022-01-31 DIAGNOSIS — I10 Essential (primary) hypertension: Secondary | ICD-10-CM | POA: Diagnosis not present

## 2022-02-28 DIAGNOSIS — I10 Essential (primary) hypertension: Secondary | ICD-10-CM | POA: Diagnosis not present

## 2022-02-28 DIAGNOSIS — M25561 Pain in right knee: Secondary | ICD-10-CM | POA: Diagnosis not present

## 2022-03-06 ENCOUNTER — Other Ambulatory Visit (HOSPITAL_COMMUNITY): Payer: Self-pay | Admitting: Internal Medicine

## 2022-03-06 DIAGNOSIS — Z1231 Encounter for screening mammogram for malignant neoplasm of breast: Secondary | ICD-10-CM

## 2022-03-18 ENCOUNTER — Ambulatory Visit (HOSPITAL_COMMUNITY)
Admission: RE | Admit: 2022-03-18 | Discharge: 2022-03-18 | Disposition: A | Discharge status: Home / Self Care | Payer: Medicare PPO | Source: Ambulatory Visit | Attending: Internal Medicine | Admitting: Internal Medicine

## 2022-03-18 ENCOUNTER — Other Ambulatory Visit: Payer: Self-pay

## 2022-03-18 DIAGNOSIS — Z1231 Encounter for screening mammogram for malignant neoplasm of breast: Secondary | ICD-10-CM | POA: Diagnosis not present

## 2022-03-19 ENCOUNTER — Inpatient Hospital Stay
Admission: RE | Admit: 2022-03-19 | Discharge: 2022-03-19 | Disposition: A | Discharge status: Home / Self Care | Payer: Self-pay | Source: Ambulatory Visit | Attending: Internal Medicine | Admitting: Internal Medicine

## 2022-03-19 ENCOUNTER — Other Ambulatory Visit (HOSPITAL_COMMUNITY): Payer: Self-pay | Admitting: Internal Medicine

## 2022-03-19 DIAGNOSIS — Z1231 Encounter for screening mammogram for malignant neoplasm of breast: Secondary | ICD-10-CM

## 2022-03-20 ENCOUNTER — Other Ambulatory Visit (HOSPITAL_COMMUNITY): Payer: Self-pay | Admitting: Internal Medicine

## 2022-03-20 DIAGNOSIS — R928 Other abnormal and inconclusive findings on diagnostic imaging of breast: Secondary | ICD-10-CM

## 2022-03-26 ENCOUNTER — Ambulatory Visit (HOSPITAL_COMMUNITY)
Admission: RE | Admit: 2022-03-26 | Discharge: 2022-03-26 | Disposition: A | Discharge status: Home / Self Care | Payer: Medicare PPO | Source: Ambulatory Visit | Attending: Internal Medicine | Admitting: Internal Medicine

## 2022-03-26 DIAGNOSIS — R928 Other abnormal and inconclusive findings on diagnostic imaging of breast: Secondary | ICD-10-CM | POA: Diagnosis not present

## 2022-03-26 DIAGNOSIS — R922 Inconclusive mammogram: Secondary | ICD-10-CM | POA: Diagnosis not present

## 2022-03-31 DIAGNOSIS — K219 Gastro-esophageal reflux disease without esophagitis: Secondary | ICD-10-CM | POA: Diagnosis not present

## 2022-03-31 DIAGNOSIS — I10 Essential (primary) hypertension: Secondary | ICD-10-CM | POA: Diagnosis not present

## 2022-04-16 ENCOUNTER — Encounter (INDEPENDENT_AMBULATORY_CARE_PROVIDER_SITE_OTHER): Payer: Self-pay | Admitting: *Deleted

## 2022-04-30 DIAGNOSIS — I10 Essential (primary) hypertension: Secondary | ICD-10-CM | POA: Diagnosis not present

## 2022-04-30 DIAGNOSIS — J45909 Unspecified asthma, uncomplicated: Secondary | ICD-10-CM | POA: Diagnosis not present

## 2022-05-14 ENCOUNTER — Telehealth: Payer: Self-pay | Admitting: Orthopaedic Surgery

## 2022-05-14 NOTE — Telephone Encounter (Signed)
Patient called to verify date of her last appointment with Dr Luna Glasgow. I verified all information and identified patient accordingly; relayed the appointment information.

## 2022-05-16 NOTE — [facility] Nurse Program (Addendum)
Encountered on 05/11/22 at [place] at [facility] in [place].   Mrs. Angulo has a provider in Dr. Legrand Rams, He is insured with Medicare.  Blood pressure reading today: 117/70 left arm, sitting , pulse 59  Recommended Mrs Sole continue to follow up with her medical provider as directed and continue medications as prescribed by his providers.  Mayra Reel RN [facility] [employer]

## 2022-05-23 ENCOUNTER — Other Ambulatory Visit (INDEPENDENT_AMBULATORY_CARE_PROVIDER_SITE_OTHER): Payer: Self-pay

## 2022-05-23 ENCOUNTER — Encounter (INDEPENDENT_AMBULATORY_CARE_PROVIDER_SITE_OTHER): Payer: Self-pay | Admitting: Gastroenterology

## 2022-05-23 ENCOUNTER — Ambulatory Visit (INDEPENDENT_AMBULATORY_CARE_PROVIDER_SITE_OTHER): Payer: Medicare PPO | Admitting: Gastroenterology

## 2022-05-23 ENCOUNTER — Encounter (INDEPENDENT_AMBULATORY_CARE_PROVIDER_SITE_OTHER): Payer: Self-pay

## 2022-05-23 ENCOUNTER — Telehealth (INDEPENDENT_AMBULATORY_CARE_PROVIDER_SITE_OTHER): Payer: Self-pay

## 2022-05-23 VITALS — BP 136/78 | HR 78 | Temp 98.2°F | Ht 68.0 in | Wt 214.9 lb

## 2022-05-23 DIAGNOSIS — R1032 Left lower quadrant pain: Secondary | ICD-10-CM | POA: Diagnosis not present

## 2022-05-23 DIAGNOSIS — T17308A Unspecified foreign body in larynx causing other injury, initial encounter: Secondary | ICD-10-CM | POA: Diagnosis not present

## 2022-05-23 MED ORDER — PEG 3350-KCL-NA BICARB-NACL 420 G PO SOLR: 4000.0000 mL | ORAL | 0 refills | Status: DC

## 2022-05-23 MED ORDER — DICYCLOMINE HCL 10 MG PO CAPS: 10.0000 mg | ORAL_CAPSULE | Freq: Two times a day (BID) | ORAL | 1 refills | Status: DC | PRN

## 2022-05-23 NOTE — Patient Instructions (Signed)
Patient was counseled about the benefit of implementing a low FODMAP to improve symptoms and recurrent episodes. A dietary list was provided to the patient. Start Bentyl 1 tablet q12h as needed for abdominal pain Schedule colonoscopy Schedule modified barium swallow

## 2022-05-23 NOTE — Progress Notes (Unsigned)
Maylon Peppers, M.D. Gastroenterology & Hepatology [facility] [address] [place], [place] [address]  Primary Care Physician: Carrolyn Meiers, MD [address] [place] [address]  I will communicate my assessment and recommendations to the referring MD via EMR.  Problems: Chronic left lower quadrant abdominal pain, likely functional Choking episodes  History of Present Illness: Lindsay Wilkerson is a 76 y.o. female with PMH diverticulitis x2, depression and HTN, who presents for follow up of left lower quadrant abdominal pain and choking.  The patient was last seen on 11/13/2020. At that time, the patient was given at 3-week course of naproxen 500 mg twice a day for management of musculoskeletal pain.  She states that she is still presenting abdominal pain in the LLQ, especially when laying on her left side or when raising up when laying down. She does not remember coming to the appointment in 2021 and is not sure if she took naproxen in the past. She has the pain all the time and states it exacerbates when she slides in her bed.  She has presented some intermittent episodes of choking when drinking liquids or when laughing, which she describes as coughing when swallowing or laughing.  The symptoms are intermittent and not happening on a daily basis.  No dysphagia or odynophagia.  No heartburn.  The patient denies having any nausea, vomiting, fever, chills, hematochezia, melena, hematemesis, abdominal distention, abdominal pain, diarrhea, jaundice, pruritus or weight loss.  CT abdomen with IV contrast 10/05/2020 IMPRESSION: 1. Mild circumferential thickening of the urinary bladder wall, possibly related to underdistention.  2. Colonic diverticulosis without evidence of acute diverticulitis. 3. No other acute abdominopelvic abnormality to provide cause for patient's symptoms. 4. Aortic Atherosclerosis  (ICD10-I70.0).   Last NTZ:GYFVC Last Colonoscopy:2018 - had polyps and advised to repeat in 5 years.  Past Medical History: Past Medical History:  Diagnosis Date   Depression, major    Hypertension    Urinary bladder incontinence     Past Surgical History: Past Surgical History:  Procedure Laterality Date   EYE SURGERY     IR FALLOPIAN TUBE CATHETERIZATION Left    1980   TONSILLECTOMY     Age 42    Family History: Family History  Problem Relation Age of Onset   Hypertension Mother    Heart disease Mother    Heart disease Father    Hypertension Sister    Kidney disease Sister    Breast cancer Sister    Rheum arthritis Sister    Heart disease Brother    Hypertension Brother     Social History: Social History   Tobacco Use  Smoking Status Former  Smokeless Tobacco Never   Social History   Substance and Sexual Activity  Alcohol Use Yes   Comment: occasionally   Social History   Substance and Sexual Activity  Drug Use No    Allergies: Allergies  Allergen Reactions   Amoxicillin    Augmentin [Amoxicillin-Pot Clavulanate] Diarrhea   Erythromycin    Sulfa Antibiotics    Sulfasalazine    Tetanus Toxoids    Tussionex Pennkinetic Er [Hydrocod Poli-Chlorphe Poli Er] Itching    Medications: Current Outpatient Medications  Medication Sig Dispense Refill   albuterol (VENTOLIN HFA) 108 (90 Base) MCG/ACT inhaler Inhale 2 puffs into the lungs every 6 (six) hours as needed for wheezing or shortness of breath. 18 g 0   atenolol-chlorthalidone (TENORETIC) 50-25 MG per tablet Take 1 tablet by mouth daily.  CLONAZEPAM PO Take 0.5 tablets by mouth daily as needed. FOR ANXIETY      fluticasone (FLONASE) 50 MCG/ACT nasal spray Place 2 sprays into both nostrils daily.     potassium chloride SA (KLOR-CON) 20 MEQ tablet Take 20 mEq by mouth 2 (two) times daily.     TRELEGY ELLIPTA 100-62.5-25 MCG/ACT AEPB Inhale 1 puff into the lungs daily.     No current  facility-administered medications for this visit.    Review of Systems: GENERAL: negative for malaise, night sweats HEENT: No changes in hearing or vision, no nose bleeds or other nasal problems. NECK: Negative for lumps, goiter, pain and significant neck swelling RESPIRATORY: Negative for cough, wheezing CARDIOVASCULAR: Negative for chest pain, leg swelling, palpitations, orthopnea GI: SEE HPI MUSCULOSKELETAL: Negative for joint pain or swelling, back pain, and muscle pain. SKIN: Negative for lesions, rash PSYCH: Negative for sleep disturbance, mood disorder and recent psychosocial stressors. HEMATOLOGY Negative for prolonged bleeding, bruising easily, and swollen nodes. ENDOCRINE: Negative for cold or heat intolerance, polyuria, polydipsia and goiter. NEURO: negative for tremor, gait imbalance, syncope and seizures. The remainder of the review of systems is noncontributory.   Physical Exam: BP 136/78 (BP Location: Left Arm, Patient Position: Sitting, Cuff Size: Large)   Pulse 78   Temp 98.2 F (36.8 C) (Oral)   Ht '5\' 8"'$  (1.727 m)   Wt 214 lb 14.4 oz (97.5 kg)   BMI 32.68 kg/m  GENERAL: The patient is AO x3, in no acute distress. HEENT: Head is normocephalic and atraumatic. EOMI are intact. Mouth is well hydrated and without lesions. NECK: Supple. No masses LUNGS: Clear to auscultation. No presence of rhonchi/wheezing/rales. Adequate chest expansion HEART: RRR, normal s1 and s2. ABDOMEN: mildly tender in LLQ, no guarding, no peritoneal signs, and nondistended. BS +. No masses. EXTREMITIES: Without any cyanosis, clubbing, rash, lesions or edema. NEUROLOGIC: AOx3, no focal motor deficit. SKIN: no jaundice, no rashes  Imaging/Labs: as above  I personally reviewed and interpreted the available labs, imaging and endoscopic files.  Impression and Plan: Lindsay Wilkerson is a 76 y.o. female with PMH diverticulitis x2, depression and HTN, who presents for follow up of left lower  quadrant abdominal pain and choking.  The patient has presented chronic episodes of abdominal pain in her left lower quadrant of unclear etiology.  She has undergone extensive evaluation in the past which has been negative for an etiology of her symptoms.  It is likely her symptoms are related to bowel hypersensitivity.  It is unclear if she had any improvement with the use of NSAIDs as there was a possibility that the symptoms were related to a musculoskeletal etiology.  For now, we will treat her symptoms with Bentyl as needed and a low FODMAP diet.  She has presented some episodes of choking which are not usually precipitated by the intake of food.  We will schedule a modified barium swallow to evaluate for oropharyngeal abnormalities.  Finally, we will schedule her for a colonoscopy given her history of colonic polyps.  - Patient was counseled about the benefit of implementing a low FODMAP to improve symptoms and recurrent episodes. A dietary list was provided to the patient. - Start Bentyl 1 tablet q12h as needed for abdominal pain - Schedule colonoscopy - Schedule modified barium swallow  All questions were answered.      Harvel Quale, MD Gastroenterology and Hepatology [facility]

## 2022-05-23 NOTE — Telephone Encounter (Signed)
Lindsay Wilkerson, CMA  ?

## 2022-05-24 ENCOUNTER — Other Ambulatory Visit (INDEPENDENT_AMBULATORY_CARE_PROVIDER_SITE_OTHER): Payer: Self-pay

## 2022-05-27 DIAGNOSIS — R1314 Dysphagia, pharyngoesophageal phase: Secondary | ICD-10-CM | POA: Diagnosis not present

## 2022-05-27 DIAGNOSIS — H6983 Other specified disorders of Eustachian tube, bilateral: Secondary | ICD-10-CM | POA: Diagnosis not present

## 2022-05-27 DIAGNOSIS — H6063 Unspecified chronic otitis externa, bilateral: Secondary | ICD-10-CM | POA: Diagnosis not present

## 2022-06-03 DIAGNOSIS — Z1389 Encounter for screening for other disorder: Secondary | ICD-10-CM | POA: Diagnosis not present

## 2022-06-03 DIAGNOSIS — Z0001 Encounter for general adult medical examination with abnormal findings: Secondary | ICD-10-CM | POA: Diagnosis not present

## 2022-06-03 DIAGNOSIS — I10 Essential (primary) hypertension: Secondary | ICD-10-CM | POA: Diagnosis not present

## 2022-06-03 DIAGNOSIS — K219 Gastro-esophageal reflux disease without esophagitis: Secondary | ICD-10-CM | POA: Diagnosis not present

## 2022-06-03 DIAGNOSIS — M542 Cervicalgia: Secondary | ICD-10-CM | POA: Diagnosis not present

## 2022-06-03 DIAGNOSIS — Z1331 Encounter for screening for depression: Secondary | ICD-10-CM | POA: Diagnosis not present

## 2022-06-06 DIAGNOSIS — R7303 Prediabetes: Secondary | ICD-10-CM | POA: Diagnosis not present

## 2022-06-06 DIAGNOSIS — I10 Essential (primary) hypertension: Secondary | ICD-10-CM | POA: Diagnosis not present

## 2022-06-06 DIAGNOSIS — Z0001 Encounter for general adult medical examination with abnormal findings: Secondary | ICD-10-CM | POA: Diagnosis not present

## 2022-06-06 DIAGNOSIS — Z1159 Encounter for screening for other viral diseases: Secondary | ICD-10-CM | POA: Diagnosis not present

## 2022-06-10 ENCOUNTER — Other Ambulatory Visit (HOSPITAL_COMMUNITY): Payer: Self-pay | Admitting: Specialist

## 2022-06-10 DIAGNOSIS — R059 Cough, unspecified: Secondary | ICD-10-CM

## 2022-06-10 DIAGNOSIS — R1312 Dysphagia, oropharyngeal phase: Secondary | ICD-10-CM

## 2022-06-10 DIAGNOSIS — T17308D Unspecified foreign body in larynx causing other injury, subsequent encounter: Secondary | ICD-10-CM

## 2022-06-18 ENCOUNTER — Encounter (INDEPENDENT_AMBULATORY_CARE_PROVIDER_SITE_OTHER): Payer: Self-pay

## 2022-06-20 ENCOUNTER — Other Ambulatory Visit (INDEPENDENT_AMBULATORY_CARE_PROVIDER_SITE_OTHER): Payer: Self-pay | Admitting: Gastroenterology

## 2022-06-20 DIAGNOSIS — R1032 Left lower quadrant pain: Secondary | ICD-10-CM

## 2022-07-01 ENCOUNTER — Other Ambulatory Visit (INDEPENDENT_AMBULATORY_CARE_PROVIDER_SITE_OTHER): Payer: Self-pay | Admitting: Gastroenterology

## 2022-07-01 ENCOUNTER — Other Ambulatory Visit (HOSPITAL_COMMUNITY): Payer: Medicare PPO

## 2022-07-01 ENCOUNTER — Ambulatory Visit
Admission: EM | Admit: 2022-07-01 | Discharge: 2022-07-01 | Disposition: A | Discharge status: Home / Self Care | Payer: Medicare PPO | Attending: Urgent Care | Admitting: Urgent Care

## 2022-07-01 ENCOUNTER — Ambulatory Visit (HOSPITAL_COMMUNITY): Payer: Medicare PPO | Admitting: Speech Pathology

## 2022-07-01 ENCOUNTER — Ambulatory Visit (INDEPENDENT_AMBULATORY_CARE_PROVIDER_SITE_OTHER): Payer: Medicare PPO

## 2022-07-01 ENCOUNTER — Ambulatory Visit: Payer: Self-pay

## 2022-07-01 DIAGNOSIS — R051 Acute cough: Secondary | ICD-10-CM

## 2022-07-01 DIAGNOSIS — B9789 Other viral agents as the cause of diseases classified elsewhere: Secondary | ICD-10-CM | POA: Diagnosis not present

## 2022-07-01 DIAGNOSIS — R059 Cough, unspecified: Secondary | ICD-10-CM | POA: Diagnosis not present

## 2022-07-01 DIAGNOSIS — R062 Wheezing: Secondary | ICD-10-CM

## 2022-07-01 DIAGNOSIS — R1032 Left lower quadrant pain: Secondary | ICD-10-CM

## 2022-07-01 DIAGNOSIS — J449 Chronic obstructive pulmonary disease, unspecified: Secondary | ICD-10-CM

## 2022-07-01 DIAGNOSIS — J988 Other specified respiratory disorders: Secondary | ICD-10-CM | POA: Diagnosis not present

## 2022-07-01 MED ORDER — PROMETHAZINE-DM 6.25-15 MG/5ML PO SYRP: 2.5000 mL | ORAL_SOLUTION | Freq: Three times a day (TID) | ORAL | 0 refills | Status: DC | PRN

## 2022-07-01 MED ORDER — PREDNISONE 20 MG PO TABS: ORAL_TABLET | ORAL | 0 refills | Status: DC

## 2022-07-01 MED ORDER — BENZONATATE 100 MG PO CAPS: 100.0000 mg | ORAL_CAPSULE | Freq: Three times a day (TID) | ORAL | 0 refills | Status: DC | PRN

## 2022-07-01 MED ORDER — CETIRIZINE HCL 5 MG PO TABS
5.0000 mg | ORAL_TABLET | Freq: Every day | ORAL | 0 refills | Status: AC
Start: 2022-07-01 — End: ?

## 2022-07-01 NOTE — ED Triage Notes (Signed)
Pt presents with c/o cough since returning from cruise on July 4  , pt is concerned with bronchitis

## 2022-07-01 NOTE — ED Provider Notes (Signed)
[place]   MRN: [id number] DOB: 1946/11/18  Subjective:   Lindsay Wilkerson is a 76 y.o. female presenting for 1 week history of persistent coughing and concerns for bronchitis.  No history of respiratory disorders.  Patient is not a smoker. Has had intermittent fatigue and generalized weakness.  She has had some congestion and drainage.  Since she got back she is also spent a lot of time with her dog and even slept with the dog in her bed.  Has a history of COPD, uses her breathing treatments daily.  No history of CHF.  No current facility-administered medications for this encounter.  Current Outpatient Medications:    albuterol (VENTOLIN HFA) 108 (90 Base) MCG/ACT inhaler, Inhale 2 puffs into the lungs every 6 (six) hours as needed for wheezing or shortness of breath., Disp: 18 g, Rfl: 0   atenolol-chlorthalidone (TENORETIC) 50-25 MG per tablet, Take 1 tablet by mouth daily.  , Disp: , Rfl:    CLONAZEPAM PO, Take 0.5 tablets by mouth daily as needed. FOR ANXIETY , Disp: , Rfl:    dicyclomine (BENTYL) 10 MG capsule, TAKE 1 CAPSULE (10 MG TOTAL) BY MOUTH EVERY 12 (TWELVE) HOURS AS NEEDED (ABDOMINAL PAIN)., Disp: 60 capsule, Rfl: 1   fluticasone (FLONASE) 50 MCG/ACT nasal spray, Place 2 sprays into both nostrils daily., Disp: , Rfl:    polyethylene glycol-electrolytes (TRILYTE) 420 g solution, Take 4,000 mLs by mouth as directed., Disp: 4000 mL, Rfl: 0   potassium chloride SA (KLOR-CON) 20 MEQ tablet, Take 20 mEq by mouth 2 (two) times daily., Disp: , Rfl:    TRELEGY ELLIPTA 100-62.5-25 MCG/ACT AEPB, Inhale 1 puff into the lungs daily., Disp: , Rfl:    Allergies  Allergen Reactions   Amoxicillin    Augmentin [Amoxicillin-Pot Clavulanate] Diarrhea   Erythromycin    Sulfa Antibiotics    Sulfasalazine    Tetanus Toxoids    Tussionex Pennkinetic Er [Hydrocod Poli-Chlorphe Poli Er] Itching    Past Medical History:  Diagnosis Date   Depression, major    Hypertension     Urinary bladder incontinence      Past Surgical History:  Procedure Laterality Date   EYE SURGERY     IR FALLOPIAN TUBE CATHETERIZATION Left    1980   TONSILLECTOMY     Age 71    Family History  Problem Relation Age of Onset   Hypertension Mother    Heart disease Mother    Heart disease Father    Hypertension Sister    Kidney disease Sister    Breast cancer Sister    Rheum arthritis Sister    Heart disease Brother    Hypertension Brother     Social History   Tobacco Use   Smoking status: Former   Smokeless tobacco: Never  Scientific laboratory technician Use: Never used  Substance Use Topics   Alcohol use: Yes    Comment: occasionally   Drug use: No    ROS   Objective:   Vitals: BP (!) 157/91   Pulse 69   Temp 98.5 F (36.9 C)   Resp 18   SpO2 96%   Physical Exam Constitutional:      General: She is not in acute distress.    Appearance: Normal appearance. She is well-developed. She is not ill-appearing, toxic-appearing or diaphoretic.  HENT:     Head: Normocephalic and atraumatic.     Nose: Nose normal.     Mouth/Throat:     Mouth: Mucous  membranes are moist.  Eyes:     General: No scleral icterus.       Right eye: No discharge.        Left eye: No discharge.     Extraocular Movements: Extraocular movements intact.  Cardiovascular:     Rate and Rhythm: Normal rate and regular rhythm.     Heart sounds: Normal heart sounds. No murmur heard.    No friction rub. No gallop.  Pulmonary:     Effort: Pulmonary effort is normal. No respiratory distress.     Breath sounds: No stridor. No wheezing, rhonchi or rales.  Chest:     Chest wall: No tenderness.  Skin:    General: Skin is warm and dry.  Neurological:     General: No focal deficit present.     Mental Status: She is alert and oriented to person, place, and time.  Psychiatric:        Mood and Affect: Mood normal.        Behavior: Behavior normal.    DG Chest 2 View  Result Date:  07/01/2022 CLINICAL DATA:  Cough. EXAM: CHEST - 2 VIEW COMPARISON:  None Available. FINDINGS: Cardiac silhouette is normal in size. Normal mediastinal and hilar contours. Clear lungs.  No pleural effusion or pneumothorax. Skeletal structures are intact. IMPRESSION: No active cardiopulmonary disease. Electronically Signed   By: Lajean Manes M.D.   On: 07/01/2022 16:22    Assessment and Plan :   PDMP not reviewed this encounter.  1. Viral respiratory illness   2. Acute cough   3. Wheezing   4. Chronic obstructive pulmonary disease, unspecified COPD type ([place])    Recommended managing for viral respiratory illness with supportive care.  In the context of her COPD, allergic rhinitis and respiratory symptoms offered her an oral prednisone course.  Use cough suppression medications otherwise as needed.  Deferred COVID testing given timeline of her illness. Counseled patient on potential for adverse effects with medications prescribed/recommended today, ER and return-to-clinic precautions discussed, patient verbalized understanding.    Jaynee Eagles, PA-C 07/01/22 1640

## 2022-07-02 ENCOUNTER — Inpatient Hospital Stay (HOSPITAL_COMMUNITY): Admission: RE | Admit: 2022-07-02 | Payer: Medicare PPO | Source: Ambulatory Visit

## 2022-07-03 DIAGNOSIS — G8929 Other chronic pain: Secondary | ICD-10-CM | POA: Diagnosis not present

## 2022-07-03 DIAGNOSIS — I10 Essential (primary) hypertension: Secondary | ICD-10-CM | POA: Diagnosis not present

## 2022-07-12 ENCOUNTER — Encounter (HOSPITAL_COMMUNITY)
Admission: RE | Admit: 2022-07-12 | Discharge: 2022-07-12 | Disposition: A | Discharge status: Home / Self Care | Payer: Medicare PPO | Source: Ambulatory Visit | Attending: Gastroenterology | Admitting: Gastroenterology

## 2022-07-12 ENCOUNTER — Encounter (HOSPITAL_COMMUNITY): Payer: Self-pay

## 2022-07-12 VITALS — Ht 68.0 in | Wt 214.9 lb

## 2022-07-12 DIAGNOSIS — I10 Essential (primary) hypertension: Secondary | ICD-10-CM

## 2022-07-16 ENCOUNTER — Ambulatory Visit (HOSPITAL_COMMUNITY): Payer: Medicare PPO

## 2022-07-16 MED ORDER — PEG 3350-KCL-NA BICARB-NACL 420 G PO SOLR: 4000.0000 mL | ORAL | 0 refills | Status: DC

## 2022-07-18 ENCOUNTER — Encounter (HOSPITAL_COMMUNITY)
Admission: RE | Disposition: A | Discharge status: Home / Self Care | Payer: Self-pay | Source: Ambulatory Visit | Attending: Gastroenterology

## 2022-07-18 ENCOUNTER — Ambulatory Visit (HOSPITAL_BASED_OUTPATIENT_CLINIC_OR_DEPARTMENT_OTHER): Payer: Medicare PPO | Admitting: Certified Registered"

## 2022-07-18 ENCOUNTER — Ambulatory Visit (HOSPITAL_COMMUNITY)
Admission: RE | Admit: 2022-07-18 | Discharge: 2022-07-18 | Disposition: A | Discharge status: Home / Self Care | Payer: Medicare PPO | Source: Ambulatory Visit | Attending: Gastroenterology | Admitting: Gastroenterology

## 2022-07-18 ENCOUNTER — Encounter (HOSPITAL_COMMUNITY): Payer: Self-pay | Admitting: Gastroenterology

## 2022-07-18 ENCOUNTER — Ambulatory Visit (HOSPITAL_COMMUNITY): Payer: Medicare PPO | Admitting: Certified Registered"

## 2022-07-18 ENCOUNTER — Other Ambulatory Visit: Payer: Self-pay

## 2022-07-18 DIAGNOSIS — K635 Polyp of colon: Secondary | ICD-10-CM

## 2022-07-18 DIAGNOSIS — Z86010 Personal history of colonic polyps: Secondary | ICD-10-CM

## 2022-07-18 DIAGNOSIS — K5790 Diverticulosis of intestine, part unspecified, without perforation or abscess without bleeding: Secondary | ICD-10-CM

## 2022-07-18 DIAGNOSIS — D123 Benign neoplasm of transverse colon: Secondary | ICD-10-CM | POA: Diagnosis not present

## 2022-07-18 DIAGNOSIS — Z87891 Personal history of nicotine dependence: Secondary | ICD-10-CM | POA: Diagnosis not present

## 2022-07-18 DIAGNOSIS — Z09 Encounter for follow-up examination after completed treatment for conditions other than malignant neoplasm: Secondary | ICD-10-CM

## 2022-07-18 DIAGNOSIS — Z1211 Encounter for screening for malignant neoplasm of colon: Secondary | ICD-10-CM | POA: Insufficient documentation

## 2022-07-18 DIAGNOSIS — K6389 Other specified diseases of intestine: Secondary | ICD-10-CM | POA: Diagnosis not present

## 2022-07-18 DIAGNOSIS — I10 Essential (primary) hypertension: Secondary | ICD-10-CM | POA: Diagnosis not present

## 2022-07-18 DIAGNOSIS — K5730 Diverticulosis of large intestine without perforation or abscess without bleeding: Secondary | ICD-10-CM | POA: Diagnosis not present

## 2022-07-18 DIAGNOSIS — K648 Other hemorrhoids: Secondary | ICD-10-CM | POA: Diagnosis not present

## 2022-07-18 DIAGNOSIS — K649 Unspecified hemorrhoids: Secondary | ICD-10-CM

## 2022-07-18 HISTORY — PX: POLYPECTOMY: SHX5525

## 2022-07-18 HISTORY — PX: COLONOSCOPY WITH PROPOFOL: SHX5780

## 2022-07-18 LAB — HM COLONOSCOPY

## 2022-07-18 SURGERY — COLONOSCOPY WITH PROPOFOL
Anesthesia: General

## 2022-07-18 MED ORDER — LIDOCAINE HCL (CARDIAC) PF 100 MG/5ML IV SOSY
PREFILLED_SYRINGE | INTRAVENOUS | Status: DC | PRN
  Administered 2022-07-18: 50 mg via INTRAVENOUS

## 2022-07-18 MED ORDER — PROPOFOL 500 MG/50ML IV EMUL
INTRAVENOUS | Status: DC | PRN
  Administered 2022-07-18: 150 ug/kg/min via INTRAVENOUS

## 2022-07-18 MED ORDER — LACTATED RINGERS IV SOLN: INTRAVENOUS | Status: DC | PRN

## 2022-07-18 MED ORDER — PHENYLEPHRINE HCL (PRESSORS) 10 MG/ML IV SOLN
INTRAVENOUS | Status: DC | PRN
  Administered 2022-07-18 (×2): 100 ug via INTRAVENOUS

## 2022-07-18 MED ORDER — LACTATED RINGERS IV SOLN
INTRAVENOUS | Status: DC
Start: 2022-07-18 — End: 2022-07-18

## 2022-07-18 MED ORDER — STERILE WATER FOR IRRIGATION IR SOLN
Status: DC | PRN
  Administered 2022-07-18: 60 mL

## 2022-07-18 MED ORDER — PROPOFOL 10 MG/ML IV BOLUS
INTRAVENOUS | Status: DC | PRN
  Administered 2022-07-18: 50 mg via INTRAVENOUS
  Administered 2022-07-18: 100 mg via INTRAVENOUS

## 2022-07-18 NOTE — H&P (Signed)
Lindsay Wilkerson is an 76 y.o. female.   Chief Complaint: history of colon polyps HPI: Lindsay Wilkerson is a 76 y.o. female with PMH diverticulitis x2, depression and HTN, coming for history of colonic polyps.  The patient denies having any nausea, vomiting, fever, chills, hematochezia, melena, hematemesis, abdominal distention, abdominal pain, diarrhea, jaundice, pruritus or weight loss.  Last Colonoscopy:2018 - had polyps and advised to repeat in 5 years.  Past Medical History:  Diagnosis Date   Depression, major    Hypertension    Urinary bladder incontinence     Past Surgical History:  Procedure Laterality Date   EYE SURGERY     IR FALLOPIAN TUBE CATHETERIZATION Left    1980   TONSILLECTOMY     Age 34    Family History  Problem Relation Age of Onset   Hypertension Mother    Heart disease Mother    Heart disease Father    Hypertension Sister    Kidney disease Sister    Breast cancer Sister    Rheum arthritis Sister    Heart disease Brother    Hypertension Brother    Social History:  reports that she has quit smoking. She has never used smokeless tobacco. She reports current alcohol use. She reports that she does not use drugs.  Allergies:  Allergies  Allergen Reactions   Amoxicillin Hives   Augmentin [Amoxicillin-Pot Clavulanate] Diarrhea   Erythromycin Other (See Comments)    Unknown   Sulfa Antibiotics Other (See Comments)    Bood poisoning   Sulfasalazine Other (See Comments)    blood poisoning   Tetanus Toxoids Other (See Comments)    Muscle swelling   Tussionex Pennkinetic Er [Hydrocod Poli-Chlorphe Poli Er] Itching    Medications Prior to Admission  Medication Sig Dispense Refill   albuterol (VENTOLIN HFA) 108 (90 Base) MCG/ACT inhaler Inhale 2 puffs into the lungs every 6 (six) hours as needed for wheezing or shortness of breath. 18 g 0   atenolol-chlorthalidone (TENORETIC) 50-25 MG per tablet Take 1 tablet by mouth daily.       cetirizine (ZYRTEC) 5  MG tablet Take 1 tablet (5 mg total) by mouth daily. (Patient taking differently: Take 10 mg by mouth daily.) 30 tablet 0   clonazePAM (KLONOPIN) 0.5 MG tablet Take 0.5 tablets by mouth daily as needed (anxiety).     polyethylene glycol-electrolytes (TRILYTE) 420 g solution Take 4,000 mLs by mouth as directed. 4000 mL 0   polyethylene glycol-electrolytes (TRILYTE) 420 g solution Take 4,000 mLs by mouth as directed. 4000 mL 0   predniSONE (DELTASONE) 20 MG tablet Take 2 tablets daily with breakfast. 10 tablet 0   TRELEGY ELLIPTA 100-62.5-25 MCG/ACT AEPB Inhale 1 puff into the lungs daily.     benzonatate (TESSALON) 100 MG capsule Take 1 capsule (100 mg total) by mouth 3 (three) times daily as needed for cough. 30 capsule 0   dicyclomine (BENTYL) 10 MG capsule TAKE 1 CAPSULE (10 MG TOTAL) BY MOUTH EVERY 12 (TWELVE) HOURS AS NEEDED (ABDOMINAL PAIN). 60 capsule 1   Fluocinolone Acetonide 0.01 % OIL Place 4 drops into both ears daily as needed for itching.     fluticasone (FLONASE) 50 MCG/ACT nasal spray Place 2 sprays into both nostrils daily.     promethazine-dextromethorphan (PROMETHAZINE-DM) 6.25-15 MG/5ML syrup Take 2.5 mLs by mouth 3 (three) times daily as needed for cough. 100 mL 0    No results found for this or any previous visit (from the past 48 hour(s)). No  results found.  Review of Systems  All other systems reviewed and are negative.   Blood pressure (!) 156/84, pulse 86, temperature 98.5 F (36.9 C), resp. rate 17, SpO2 95 %. Physical Exam  GENERAL: The patient is AO x3, in no acute distress. HEENT: Head is normocephalic and atraumatic. EOMI are intact. Mouth is well hydrated and without lesions. NECK: Supple. No masses LUNGS: Clear to auscultation. No presence of rhonchi/wheezing/rales. Adequate chest expansion HEART: RRR, normal s1 and s2. ABDOMEN: Soft, nontender, no guarding, no peritoneal signs, and nondistended. BS +. No masses. EXTREMITIES: Without any cyanosis,  clubbing, rash, lesions or edema. NEUROLOGIC: AOx3, no focal motor deficit. SKIN: no jaundice, no rashes  Assessment/Plan  Lindsay Wilkerson is a 76 y.o. female with PMH diverticulitis x2, depression and HTN, coming for history of colonic polyps.  We will proceed with colonoscopy.  Harvel Quale, MD 07/18/2022, 10:34 AM

## 2022-07-18 NOTE — Op Note (Addendum)
[facility] Patient Name: Lindsay Wilkerson Procedure Date: 07/18/2022 10:02 AM MRN: [id number] Date of Birth: Apr 03, 1946 Attending MD: Maylon Peppers ,  CSN: [id number] Age: 76 Admit Type: Outpatient Procedure:                Colonoscopy Indications:              Surveillance: Personal history of colonic polyps                            (unknown histology) on last colonoscopy 5 years ago Providers:                Maylon Peppers, Caprice Kluver, Aram Candela Referring MD:              Medicines:                Monitored Anesthesia Care Complications:            No immediate complications. Estimated Blood Loss:     Estimated blood loss: none. Procedure:                Pre-Anesthesia Assessment:                           - Prior to the procedure, a History and Physical                            was performed, and patient medications, allergies                            and sensitivities were reviewed. The patient's                            tolerance of previous anesthesia was reviewed.                           - The risks and benefits of the procedure and the                            sedation options and risks were discussed with the                            patient. All questions were answered and informed                            consent was obtained.                           - ASA Grade Assessment: II - A patient with mild                            systemic disease.                           After obtaining informed consent, the colonoscope                            was passed under direct  vision. Throughout the                            procedure, the patient's blood pressure, pulse, and                            oxygen saturations were monitored continuously. The                            [id number]-HQ190L ([id number]) scope was introduced through                            the anus and advanced to the the cecum, identified                            by appendiceal  orifice and ileocecal valve. The                            colonoscopy was performed without difficulty. The                            patient tolerated the procedure well. The quality                            of the bowel preparation was excellent. Scope In: 10:39:42 AM Scope Out: 11:03:45 AM Scope Withdrawal Time: 0 hours 20 minutes 15 seconds  Total Procedure Duration: 0 hours 24 minutes 3 seconds  Findings:      The perianal and digital rectal examinations were normal.      A 1 mm polyp was found in the ascending colon. The polyp was sessile.       The polyp was removed with a cold biopsy forceps. Resection and       retrieval were complete.      A 2 mm polyp was found in the transverse colon. The polyp was sessile.       The polyp was removed with a cold snare. Resection and retrieval were       complete.      Multiple small and large-mouthed diverticula were found in the sigmoid       colon and descending colon.      An area of congested mucosa was found in the sigmoid colon in proximity       to some of the diverticula (SCAD?).      Non-bleeding internal hemorrhoids were found during retroflexion. The       hemorrhoids were small. Impression:               - One 1 mm polyp in the ascending colon, removed                            with a cold biopsy forceps. Resected and retrieved.                           - One 2 mm polyp in the transverse colon, removed  with a cold snare. Resected and retrieved.                           - Diverticulosis in the sigmoid colon and in the                            descending colon.                           - Congested mucosa in the sigmoid colon, possible                            SCAD.                           - Non-bleeding internal hemorrhoids. Moderate Sedation:      Per Anesthesia Care Recommendation:           - Discharge patient to home (ambulatory).                           - Resume previous diet.                            - Await pathology results.                           - Repeat colonoscopy is not recommended due to                            current age (42 years or older) for screening                            purposes.                           - If recurrent episodes of abdominal pain, can                            start mesalamine for SCAD. Procedure Code(s):        --- Professional ---                           [phone], Colonoscopy, flexible; with removal of                            tumor(s), polyp(s), or other lesion(s) by snare                            technique                           28315, 22, Colonoscopy, flexible; with biopsy,                            single or multiple Diagnosis Code(s):        --- Professional ---  Z86.010, Personal history of colonic polyps                           K63.5, Polyp of colon                           K64.8, Other hemorrhoids                           K63.89, Other specified diseases of intestine                           K57.30, Diverticulosis of large intestine without                            perforation or abscess without bleeding CPT copyright 2019 American Medical Association. All rights reserved. The codes documented in this report are preliminary and upon coder review may  be revised to meet current compliance requirements. Maylon Peppers, MD Maylon Peppers,  07/18/2022 11:13:53 AM This report has been signed electronically. Number of Addenda: 0

## 2022-07-18 NOTE — Discharge Instructions (Signed)
You are being discharged to home.  Resume your previous diet.  We are waiting for your pathology results.  Your physician has indicated that a repeat colonoscopy is not recommended due to your current age (2 years or older) for screening purposes.  If recurrent episodes of abdominal pain, can start mesalamine for SCAD.

## 2022-07-18 NOTE — Transfer of Care (Signed)
Immediate Anesthesia Transfer of Care Note  Patient: Lindsay Wilkerson  Procedure(s) Performed: COLONOSCOPY WITH PROPOFOL POLYPECTOMY  Patient Location: Short Stay  Anesthesia Type:General  Level of Consciousness: awake  Airway & Oxygen Therapy: Patient Spontanous Breathing  Post-op Assessment: Report given to RN and Post -op Vital signs reviewed and stable  Post vital signs: Reviewed and stable  Last Vitals:  Vitals Value Taken Time  BP    Temp    Pulse    Resp    SpO2      Last Pain:  Vitals:   07/18/22 1035  PainSc: 0-No pain         Complications: No notable events documented.

## 2022-07-18 NOTE — Anesthesia Preprocedure Evaluation (Signed)
Anesthesia Evaluation  Patient identified by MRN, date of birth, ID band Patient awake    Reviewed: Allergy & Precautions, H&P , NPO status , Patient's Chart, lab work & pertinent test results, reviewed documented beta blocker date and time   Airway Mallampati: II  TM Distance: >3 FB Neck ROM: full    Dental no notable dental hx.    Pulmonary neg pulmonary ROS, former smoker,    Pulmonary exam normal breath sounds clear to auscultation       Cardiovascular Exercise Tolerance: Good hypertension, negative cardio ROS   Rhythm:regular Rate:Normal     Neuro/Psych PSYCHIATRIC DISORDERS Depression negative neurological ROS     GI/Hepatic negative GI ROS, Neg liver ROS,   Endo/Other  negative endocrine ROS  Renal/GU negative Renal ROS  negative genitourinary   Musculoskeletal   Abdominal   Peds  Hematology negative hematology ROS (+)   Anesthesia Other Findings   Reproductive/Obstetrics negative OB ROS                             Anesthesia Physical Anesthesia Plan  ASA: 2  Anesthesia Plan: General   Post-op Pain Management:    Induction:   PONV Risk Score and Plan: Propofol infusion  Airway Management Planned:   Additional Equipment:   Intra-op Plan:   Post-operative Plan:   Informed Consent: I have reviewed the patients History and Physical, chart, labs and discussed the procedure including the risks, benefits and alternatives for the proposed anesthesia with the patient or authorized representative who has indicated his/her understanding and acceptance.     Dental Advisory Given  Plan Discussed with: CRNA  Anesthesia Plan Comments:         Anesthesia Quick Evaluation

## 2022-07-18 NOTE — Anesthesia Procedure Notes (Signed)
Date/Time: 07/18/2022 10:40 AM  Performed by: Orlie Dakin, CRNAPre-anesthesia Checklist: Patient identified, Emergency Drugs available, Suction available and Patient being monitored Patient Re-evaluated:Patient Re-evaluated prior to induction Oxygen Delivery Method: Nasal cannula Induction Type: IV induction Placement Confirmation: positive ETCO2

## 2022-07-19 ENCOUNTER — Encounter (INDEPENDENT_AMBULATORY_CARE_PROVIDER_SITE_OTHER): Payer: Self-pay | Admitting: *Deleted

## 2022-07-19 LAB — SURGICAL PATHOLOGY

## 2022-07-19 NOTE — Anesthesia Postprocedure Evaluation (Signed)
Anesthesia Post Note  Patient: Patent examiner  Procedure(s) Performed: COLONOSCOPY WITH PROPOFOL POLYPECTOMY  Patient location during evaluation: Phase II Anesthesia Type: General Level of consciousness: awake Pain management: pain level controlled Vital Signs Assessment: post-procedure vital signs reviewed and stable Respiratory status: spontaneous breathing and respiratory function stable Cardiovascular status: blood pressure returned to baseline and stable Postop Assessment: no headache and no apparent nausea or vomiting Anesthetic complications: no Comments: Late entry   No notable events documented.   Last Vitals:  Vitals:   07/18/22 1108 07/18/22 1111  BP: (!) 87/39 (!) 103/51  Pulse: 65 67  Resp: (!) 21 (!) 21  Temp: (!) 36.3 C   SpO2: 95% 94%    Last Pain:  Vitals:   07/18/22 1111  TempSrc:   PainSc: 0-No pain                 Louann Sjogren

## 2022-07-23 ENCOUNTER — Encounter (HOSPITAL_COMMUNITY): Payer: Self-pay | Admitting: Gastroenterology

## 2022-07-23 ENCOUNTER — Ambulatory Visit (HOSPITAL_COMMUNITY): Payer: Medicare PPO | Admitting: Speech Pathology

## 2022-07-25 ENCOUNTER — Encounter (HOSPITAL_COMMUNITY): Payer: Self-pay | Admitting: Speech Pathology

## 2022-07-25 ENCOUNTER — Ambulatory Visit (HOSPITAL_COMMUNITY): Payer: Medicare PPO | Attending: Gastroenterology | Admitting: Speech Pathology

## 2022-07-25 ENCOUNTER — Ambulatory Visit (HOSPITAL_COMMUNITY)
Admission: RE | Admit: 2022-07-25 | Discharge: 2022-07-25 | Disposition: A | Discharge status: Home / Self Care | Payer: Medicare PPO | Source: Ambulatory Visit | Attending: Gastroenterology | Admitting: Gastroenterology

## 2022-07-25 DIAGNOSIS — R1310 Dysphagia, unspecified: Secondary | ICD-10-CM | POA: Diagnosis not present

## 2022-07-25 DIAGNOSIS — T17308D Unspecified foreign body in larynx causing other injury, subsequent encounter: Secondary | ICD-10-CM | POA: Insufficient documentation

## 2022-07-25 DIAGNOSIS — R1312 Dysphagia, oropharyngeal phase: Secondary | ICD-10-CM | POA: Insufficient documentation

## 2022-07-25 DIAGNOSIS — R059 Cough, unspecified: Secondary | ICD-10-CM | POA: Insufficient documentation

## 2022-07-25 NOTE — Therapy (Signed)
[place], [place], [address] Phone: [phone]   Fax:  [phone]  Modified Barium Swallow  Patient Details  Name: Lindsay Wilkerson MRN: [id number] Date of Birth: 1946/07/17 No data recorded  Encounter Date: 07/25/2022   End of Session - 07/25/22 1519     Visit Number 1    Number of Visits 1    Authorization Type [employer]    SLP Start Time 1200    SLP Stop Time  1230    SLP Time Calculation (min) 30 min    Activity Tolerance Patient tolerated treatment well             Past Medical History:  Diagnosis Date   Depression, major    Hypertension    Urinary bladder incontinence     Past Surgical History:  Procedure Laterality Date   COLONOSCOPY WITH PROPOFOL N/A 07/18/2022   Procedure: COLONOSCOPY WITH PROPOFOL;  Surgeon: Harvel Quale, MD;  Location: [facility];  Service: Gastroenterology;  Laterality: N/A;  1035 ASA 1   EYE SURGERY     IR FALLOPIAN TUBE CATHETERIZATION Left    1980   POLYPECTOMY  07/18/2022   Procedure: POLYPECTOMY;  Surgeon: Harvel Quale, MD;  Location: [facility];  Service: Gastroenterology;;   TONSILLECTOMY     Age 76    There were no vitals filed for this visit.        General - 07/25/22 1514       General Information   Date of Onset 05/23/22    HPI Lindsay Wilkerson is a 76 yo female who was referred by Dr. Jenetta Downer for MBSS due to Pt reporting difficulty swallowing liquids, crumbs, and saliva at times. She indicates that it doesn't happen weekly, but that when it does, she coughs excessively and feels as if her "head will explode".    Type of Study MBS-Modified Barium Swallow Study    Diet Prior to this Study Regular;Thin liquids    Temperature Spikes Noted No    Respiratory Status Room air    History of Recent Intubation No    Behavior/Cognition Alert;Cooperative;Pleasant mood    Oral Cavity Assessment Within Functional Limits     Oral Care Completed by SLP No    Oral Cavity - Dentition Adequate natural dentition    Vision Functional for self feeding    Self-Feeding Abilities Able to feed self    Patient Positioning Upright in chair    Baseline Vocal Quality Normal    Volitional Cough Strong    Volitional Swallow Able to elicit    Anatomy Within functional limits    Pharyngeal Secretions Not observed secondary MBS                Oral Preparation/Oral Phase - 07/25/22 1517       Oral Preparation/Oral Phase   Oral Phase Within functional limits      Electrical stimulation - Oral Phase   Was Electrical Stimulation Used No              Pharyngeal Phase - 07/25/22 1518       Pharyngeal Phase   Pharyngeal Phase Within functional limits      Electrical Stimulation - Pharyngeal Phase   Was Electrical Stimulation Used No              Cricopharyngeal Phase - 07/25/22 1518       Cervical Esophageal Phase   Cervical Esophageal Phase Impaired  Cervical Esophageal Phase - Comment   Other Esophageal Phase Observations Barium tablet was transiently delayed near the GEJ, but passed after liquid wash                      Plan - 07/25/22 1520     Clinical Impression Statement MBSS completed and Pt presents with normal oropharyngeal swallow when assessed with barium tinged thins via tsp/cup/straw, puree, regular textures, and barium tablet. No penetration, aspiration, or residuals noted and Pt with timely swallow. Esophageal sweep revealed delayed passage of barium tablet through the GEJ, but it did eventually pass after liquid wash. Pt's symptoms and reports of coughing were not replicated during today's study despite challenging Pt with sequential and straw sips. Pt does report h/o COPD and Asthma. She was given reflux precautions and information on respiratory/swallow coordination (small sips/bites and avoid talking duirng meals etc). Recommend regular textures and thin liquids  and Pt to keep a swallow journal to track when she notes difficulty swallowing to look for patterns. Pt in agreement and appreciative of information provided.    Consulted and Agree with Plan of Care Patient             Patient will benefit from skilled therapeutic intervention in order to improve the following deficits and impairments:   Dysphagia, oropharyngeal phase     Recommendations/Treatment - 07/25/22 1519       Swallow Evaluation Recommendations   SLP Diet Recommendations Age appropriate regular;Thin    Liquid Administration via Cup;Straw    Medication Administration Whole meds with liquid    Supervision Patient able to self feed    Compensations Small sips/bites    Postural Changes Seated upright at 90 degrees;Remain upright for at least 30 minutes after feeds/meals              Prognosis - 07/25/22 1519       Prognosis   Prognosis for Safe Diet Advancement Good      Individuals Consulted   Consulted and Agree with Results and Recommendations Patient    Report Sent to  Referring physician             Problem List Patient Active Problem List   Diagnosis Date Noted   Choking 05/23/2022   Abdominal pain, left lower quadrant 11/13/2020   Thank you,  Genene Churn, [place]  Genene Churn, [place] 07/25/2022, 3:25 PM  [place], [place], [address] Phone: [phone]   Fax:  [phone]  Name: Lindsay Wilkerson MRN: [id number] Date of Birth: 11-Sep-1946

## 2022-07-30 ENCOUNTER — Encounter (HOSPITAL_COMMUNITY): Payer: Self-pay | Admitting: Emergency Medicine

## 2022-07-30 ENCOUNTER — Other Ambulatory Visit: Payer: Self-pay

## 2022-07-30 ENCOUNTER — Telehealth (INDEPENDENT_AMBULATORY_CARE_PROVIDER_SITE_OTHER): Payer: Self-pay | Admitting: *Deleted

## 2022-07-30 ENCOUNTER — Emergency Department (HOSPITAL_COMMUNITY): Payer: Medicare PPO

## 2022-07-30 ENCOUNTER — Emergency Department (HOSPITAL_COMMUNITY)
Admission: EM | Admit: 2022-07-30 | Discharge: 2022-07-30 | Disposition: A | Discharge status: Home / Self Care | Payer: Medicare PPO | Attending: Emergency Medicine | Admitting: Emergency Medicine

## 2022-07-30 DIAGNOSIS — R059 Cough, unspecified: Secondary | ICD-10-CM | POA: Insufficient documentation

## 2022-07-30 DIAGNOSIS — R531 Weakness: Secondary | ICD-10-CM | POA: Insufficient documentation

## 2022-07-30 DIAGNOSIS — E876 Hypokalemia: Secondary | ICD-10-CM | POA: Insufficient documentation

## 2022-07-30 DIAGNOSIS — R5383 Other fatigue: Secondary | ICD-10-CM | POA: Insufficient documentation

## 2022-07-30 DIAGNOSIS — Z20822 Contact with and (suspected) exposure to covid-19: Secondary | ICD-10-CM | POA: Diagnosis not present

## 2022-07-30 DIAGNOSIS — R519 Headache, unspecified: Secondary | ICD-10-CM | POA: Diagnosis not present

## 2022-07-30 DIAGNOSIS — R110 Nausea: Secondary | ICD-10-CM | POA: Diagnosis not present

## 2022-07-30 LAB — CBC
HCT: 40.9 % (ref 36.0–46.0)
Hemoglobin: 13.4 g/dL (ref 12.0–15.0)
MCH: 28.1 pg (ref 26.0–34.0)
MCHC: 32.8 g/dL (ref 30.0–36.0)
MCV: 85.7 fL (ref 80.0–100.0)
Platelets: 256 10*3/uL (ref 150–400)
RBC: 4.77 MIL/uL (ref 3.87–5.11)
RDW: 14.8 % (ref 11.5–15.5)
WBC: 4.5 10*3/uL (ref 4.0–10.5)
nRBC: 0 % (ref 0.0–0.2)

## 2022-07-30 LAB — COMPREHENSIVE METABOLIC PANEL
ALT: 14 U/L (ref 0–44)
AST: 15 U/L (ref 15–41)
Albumin: 3.5 g/dL (ref 3.5–5.0)
Alkaline Phosphatase: 52 U/L (ref 38–126)
Anion gap: 2 — ABNORMAL LOW (ref 5–15)
BUN: 17 mg/dL (ref 8–23)
CO2: 30 mmol/L (ref 22–32)
Calcium: 8.7 mg/dL — ABNORMAL LOW (ref 8.9–10.3)
Chloride: 107 mmol/L (ref 98–111)
Creatinine, Ser: 1.01 mg/dL — ABNORMAL HIGH (ref 0.44–1.00)
GFR, Estimated: 58 mL/min — ABNORMAL LOW (ref >60)
Glucose, Bld: 104 mg/dL — ABNORMAL HIGH (ref 70–99)
Potassium: 2.9 mmol/L — ABNORMAL LOW (ref 3.5–5.1)
Sodium: 139 mmol/L (ref 135–145)
Total Bilirubin: 0.8 mg/dL (ref 0.3–1.2)
Total Protein: 6.7 g/dL (ref 6.5–8.1)

## 2022-07-30 LAB — SARS CORONAVIRUS 2 BY RT PCR: SARS Coronavirus 2 by RT PCR: NEGATIVE

## 2022-07-30 LAB — URINALYSIS, ROUTINE W REFLEX MICROSCOPIC
Bilirubin Urine: NEGATIVE
Glucose, UA: NEGATIVE mg/dL
Hgb urine dipstick: NEGATIVE
Ketones, ur: NEGATIVE mg/dL
Leukocytes,Ua: NEGATIVE
Nitrite: NEGATIVE
Protein, ur: NEGATIVE mg/dL
Specific Gravity, Urine: 1.009 (ref 1.005–1.030)
pH: 6 (ref 5.0–8.0)

## 2022-07-30 LAB — TROPONIN I (HIGH SENSITIVITY)
Troponin I (High Sensitivity): 15 ng/L (ref <18)
Troponin I (High Sensitivity): 20 ng/L — ABNORMAL HIGH (ref <18)

## 2022-07-30 LAB — CBG MONITORING, ED: Glucose-Capillary: 104 mg/dL — ABNORMAL HIGH (ref 70–99)

## 2022-07-30 LAB — MAGNESIUM: Magnesium: 1.8 mg/dL (ref 1.7–2.4)

## 2022-07-30 LAB — LIPASE, BLOOD: Lipase: 23 U/L (ref 11–51)

## 2022-07-30 MED ORDER — SODIUM CHLORIDE 0.9 % IV BOLUS
1000.0000 mL | Freq: Once | INTRAVENOUS | Status: AC
Start: 2022-07-30 — End: 2022-07-30
  Administered 2022-07-30: 1000 mL via INTRAVENOUS

## 2022-07-30 MED ORDER — MAGNESIUM SULFATE 2 GM/50ML IV SOLN
2.0000 g | Freq: Once | INTRAVENOUS | Status: AC
  Administered 2022-07-30: 2 g via INTRAVENOUS
  Filled 2022-07-30: qty 50

## 2022-07-30 MED ORDER — POTASSIUM CHLORIDE CRYS ER 20 MEQ PO TBCR: 20.0000 meq | EXTENDED_RELEASE_TABLET | Freq: Every day | ORAL | 0 refills | Status: AC

## 2022-07-30 MED ORDER — POTASSIUM CHLORIDE CRYS ER 20 MEQ PO TBCR
40.0000 meq | EXTENDED_RELEASE_TABLET | Freq: Once | ORAL | Status: AC
  Administered 2022-07-30: 40 meq via ORAL
  Filled 2022-07-30: qty 2

## 2022-07-30 NOTE — Discharge Instructions (Addendum)
You were seen in the emergency department for headache weakness sweats.  You had lab work done that showed your potassium to be low.  You are given some fluids potassium and we are sending you home with a prescription for potassium for 5 more days.  Please follow-up with your primary care doctor to get some repeat blood work done to make sure that your potassium is improved.  Continue your other medications.  Return to the emergency department if any worsening or concerning symptoms.

## 2022-07-30 NOTE — Telephone Encounter (Signed)
I called patient to see if she followed up with pcp and she states she did and he recommended ED and she states she is going to ED now.

## 2022-07-30 NOTE — Telephone Encounter (Signed)
Thanks for the update, hopefully she did not have any major injury. Agree with follow up with PCP and to keep hydrated with pedialyte

## 2022-07-30 NOTE — ED Provider Notes (Signed)
[facility] EMERGENCY DEPARTMENT Provider Note   CSN: [id number] Arrival date & time: 07/30/22  1355     History {Add pertinent medical, surgical, social history, OB history to HPI:1} Chief Complaint  Patient presents with   Headache    Lindsay Wilkerson is a 76 y.o. female.  She said she has not felt well for a couple of weeks.  She has had intermittent headaches and feeling weak all over.  She has had intermittent sweats and nausea.  She said all her symptoms started after she had a colonoscopy on the 27th.  She also had a URI prior to that with prednisone and antibiotics.  She continues to have a nonproductive cough.  No fevers or chills no chest pain abdominal pain vomiting diarrhea or urinary symptoms.  Has been using Pedialyte with minimal improvement.  Called her PCP today who recommended she come to the emergency department for lab work.  The history is provided by the patient.  Headache Pain location:  Generalized Quality:  Dull Onset quality:  Gradual Duration:  2 weeks Timing:  Intermittent Progression:  Unchanged Chronicity:  New Relieved by:  Nothing Worsened by:  Nothing Associated symptoms: cough, fatigue and nausea   Associated symptoms: no abdominal pain, no diarrhea, no fever and no focal weakness        Home Medications Prior to Admission medications   Medication Sig Start Date End Date Taking? Authorizing Provider  albuterol (VENTOLIN HFA) 108 (90 Base) MCG/ACT inhaler Inhale 2 puffs into the lungs every 6 (six) hours as needed for wheezing or shortness of breath. 12/22/21   Volney American, PA-C  atenolol-chlorthalidone (TENORETIC) 50-25 MG per tablet Take 1 tablet by mouth daily.      [provider]  benzonatate (TESSALON) 100 MG capsule Take 1 capsule (100 mg total) by mouth 3 (three) times daily as needed for cough. 07/01/22   Jaynee Eagles, PA-C  cetirizine (ZYRTEC) 5 MG tablet Take 1 tablet (5 mg total) by mouth daily. Patient taking  differently: Take 10 mg by mouth daily. 07/01/22   Jaynee Eagles, PA-C  clonazePAM (KLONOPIN) 0.5 MG tablet Take 0.5 tablets by mouth daily as needed (anxiety).    [provider]  dicyclomine (BENTYL) 10 MG capsule TAKE 1 CAPSULE (10 MG TOTAL) BY MOUTH EVERY 12 (TWELVE) HOURS AS NEEDED (ABDOMINAL PAIN). 06/20/22   Harvel Quale, MD  Fluocinolone Acetonide 0.01 % OIL Place 4 drops into both ears daily as needed for itching. 05/27/22   [provider]  fluticasone (FLONASE) 50 MCG/ACT nasal spray Place 2 sprays into both nostrils daily.    [provider]  predniSONE (DELTASONE) 20 MG tablet Take 2 tablets daily with breakfast. 07/01/22   Jaynee Eagles, PA-C  promethazine-dextromethorphan (PROMETHAZINE-DM) 6.25-15 MG/5ML syrup Take 2.5 mLs by mouth 3 (three) times daily as needed for cough. 07/01/22   Jaynee Eagles, PA-C  TRELEGY ELLIPTA 100-62.5-25 MCG/ACT AEPB Inhale 1 puff into the lungs daily. 01/03/22   [provider]      Allergies    Amoxicillin, Augmentin [amoxicillin-pot clavulanate], Erythromycin, Sulfa antibiotics, Sulfasalazine, Tetanus toxoids, and Tussionex pennkinetic er [hydrocod poli-chlorphe poli er]    Review of Systems   Review of Systems  Constitutional:  Positive for fatigue. Negative for fever.  Respiratory:  Positive for cough.   Gastrointestinal:  Positive for nausea. Negative for abdominal pain and diarrhea.  Neurological:  Positive for headaches. Negative for focal weakness.    Physical Exam Updated Vital Signs BP 136/71 (  BP Location: Right Arm)   Pulse 77   Temp 98.8 F (37.1 C) (Oral)   Resp 18   Ht '5\' 8"'$  (1.727 m)   Wt 97.5 kg   SpO2 95%   BMI 32.69 kg/m  Physical Exam Vitals and nursing note reviewed.  Constitutional:      General: She is not in acute distress.    Appearance: Normal appearance. She is well-developed.  HENT:     Head: Normocephalic and atraumatic.  Eyes:     Conjunctiva/sclera: Conjunctivae  normal.  Cardiovascular:     Rate and Rhythm: Normal rate and regular rhythm.     Heart sounds: No murmur heard. Pulmonary:     Effort: Pulmonary effort is normal. No respiratory distress.     Breath sounds: Normal breath sounds.  Abdominal:     Palpations: Abdomen is soft.     Tenderness: There is no abdominal tenderness. There is no guarding or rebound.  Musculoskeletal:        General: No swelling.     Cervical back: Neck supple.  Skin:    General: Skin is warm and dry.     Capillary Refill: Capillary refill takes less than 2 seconds.  Neurological:     Mental Status: She is alert.     GCS: GCS eye subscore is 4. GCS verbal subscore is 5. GCS motor subscore is 6.     Cranial Nerves: No cranial nerve deficit, dysarthria or facial asymmetry.     Sensory: No sensory deficit.     Motor: No weakness.     ED Results / Procedures / Treatments   Labs (all labs ordered are listed, but only abnormal results are displayed) Labs Reviewed  CBG MONITORING, ED - Abnormal; Notable for the following components:      Result Value   Glucose-Capillary 104 (*)    All other components within normal limits  SARS CORONAVIRUS 2 BY RT PCR  CBC  URINALYSIS, ROUTINE W REFLEX MICROSCOPIC  MAGNESIUM  COMPREHENSIVE METABOLIC PANEL  LIPASE, BLOOD  TROPONIN I (HIGH SENSITIVITY)    EKG EKG Interpretation  Date/Time:  Tuesday July 30 2022 14:30:14 EDT Ventricular Rate:  71 PR Interval:  157 QRS Duration: 117 QT Interval:  398 QTC Calculation: 433 R Axis:   54 Text Interpretation: Sinus rhythm Nonspecific intraventricular conduction delay Anteroseptal infarct, old Nonspecific T abnormalities, inferior leads No significant change since prior 7/23 Confirmed by Aletta Edouard [phone]) on 07/30/2022 3:02:23 PM  Radiology No results found.  Procedures Procedures  {Document cardiac monitor, telemetry assessment procedure when appropriate:1}  Medications Ordered in ED Medications  sodium  chloride 0.9 % bolus 1,000 mL (has no administration in time range)    ED Course/ Medical Decision Making/ A&P                           Medical Decision Making Amount and/or Complexity of Data Reviewed Labs: ordered. Radiology: ordered.   This patient complains of ***; this involves an extensive number of treatment Options and is a complaint that carries with it a high risk of complications and morbidity. The differential includes ***  I ordered, reviewed and interpreted labs, which included *** I ordered medication *** and reviewed PMP when indicated. I ordered imaging studies which included *** and I independently    visualized and interpreted imaging which showed *** Additional history obtained from *** Previous records obtained and reviewed *** I consulted *** and discussed lab and  imaging findings and discussed disposition.  Cardiac monitoring reviewed, *** Social determinants considered, *** Critical Interventions: ***  After the interventions stated above, I reevaluated the patient and found *** Admission and further testing considered, ***   {Document critical care time when appropriate:1} {Document review of labs and clinical decision tools ie heart score, Chads2Vasc2 etc:1}  {Document your independent review of radiology images, and any outside records:1} {Document your discussion with family members, caretakers, and with consultants:1} {Document social determinants of health affecting pt's care:1} {Document your decision making why or why not admission, treatments were needed:1} Final Clinical Impression(s) / ED Diagnoses Final diagnoses:  None    Rx / DC Orders ED Discharge Orders     None

## 2022-07-30 NOTE — Telephone Encounter (Signed)
Patient called yesterday and states she had colonoscopy on 07/18/22 and that evening she fell when she got home. States diastolic has been running 48-54. Has been weak, headache and dizziness. She started drinking pedialyte and it helped for a few days and symptoms started back. I advised her to call pcp. She states this also happened last time she had a procedure. She wanted me to let Dr. Jenetta Downer know about this and I told her I would but that she should still call her pcp.

## 2022-07-30 NOTE — ED Triage Notes (Signed)
Pt presents with headache, weakness, nausea, sweating, since having colonoscopy on July 27th.

## 2022-08-05 DIAGNOSIS — I10 Essential (primary) hypertension: Secondary | ICD-10-CM | POA: Diagnosis not present

## 2022-08-06 DIAGNOSIS — I10 Essential (primary) hypertension: Secondary | ICD-10-CM | POA: Diagnosis not present

## 2022-08-06 DIAGNOSIS — E782 Mixed hyperlipidemia: Secondary | ICD-10-CM | POA: Diagnosis not present

## 2022-08-16 DIAGNOSIS — E1165 Type 2 diabetes mellitus with hyperglycemia: Secondary | ICD-10-CM | POA: Diagnosis not present

## 2022-08-16 DIAGNOSIS — F419 Anxiety disorder, unspecified: Secondary | ICD-10-CM | POA: Diagnosis not present

## 2022-08-16 DIAGNOSIS — J45909 Unspecified asthma, uncomplicated: Secondary | ICD-10-CM | POA: Diagnosis not present

## 2022-08-16 DIAGNOSIS — I10 Essential (primary) hypertension: Secondary | ICD-10-CM | POA: Diagnosis not present

## 2022-08-29 DIAGNOSIS — Z1330 Encounter for screening examination for mental health and behavioral disorders, unspecified: Secondary | ICD-10-CM | POA: Diagnosis not present

## 2022-08-29 DIAGNOSIS — R0609 Other forms of dyspnea: Secondary | ICD-10-CM | POA: Diagnosis not present

## 2022-08-29 DIAGNOSIS — R5383 Other fatigue: Secondary | ICD-10-CM | POA: Diagnosis not present

## 2022-08-29 DIAGNOSIS — E119 Type 2 diabetes mellitus without complications: Secondary | ICD-10-CM | POA: Diagnosis not present

## 2022-08-29 DIAGNOSIS — Z1331 Encounter for screening for depression: Secondary | ICD-10-CM | POA: Diagnosis not present

## 2022-09-01 IMAGING — MG MM DIGITAL DIAGNOSTIC UNILAT*R* W/ TOMO W/ CAD
6 series · 6 of 18 positions shown · non-contrast
Comparison: Previous exam(s).

CLINICAL DATA: 75-year-old female recalled from screening mammogram
dated 03/18/2022 for a possible right breast asymmetry.

EXAM:
DIGITAL DIAGNOSTIC UNILATERAL RIGHT MAMMOGRAM WITH TOMOSYNTHESIS AND
CAD
TECHNIQUE: Right digital diagnostic mammography and breast tomosynthesis was
performed. The images were evaluated with computer-aided detection.

[R ML synth-2D]
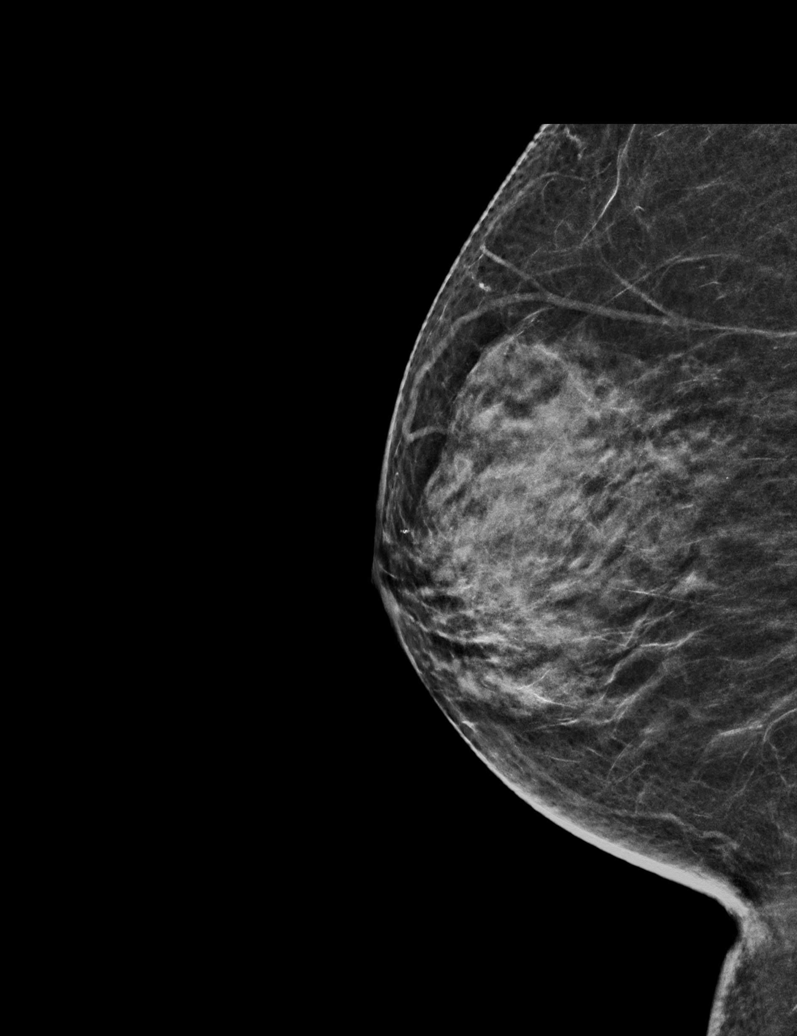

[R MLO synth-2D]
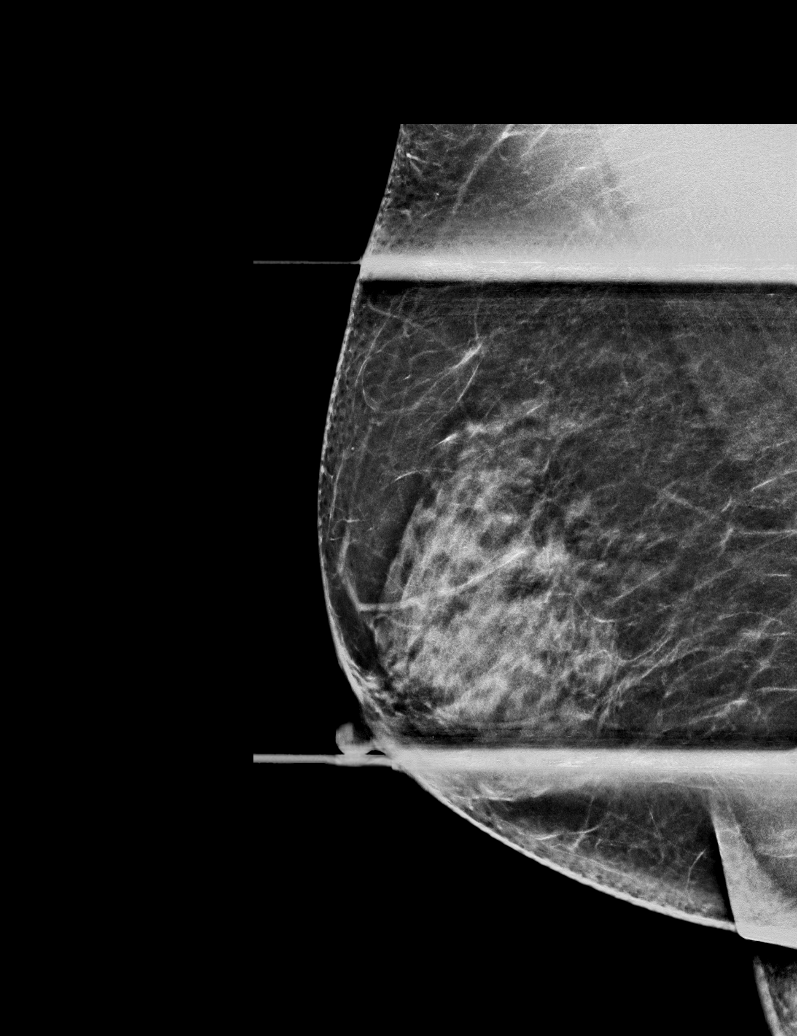

[R CC synth-2D]
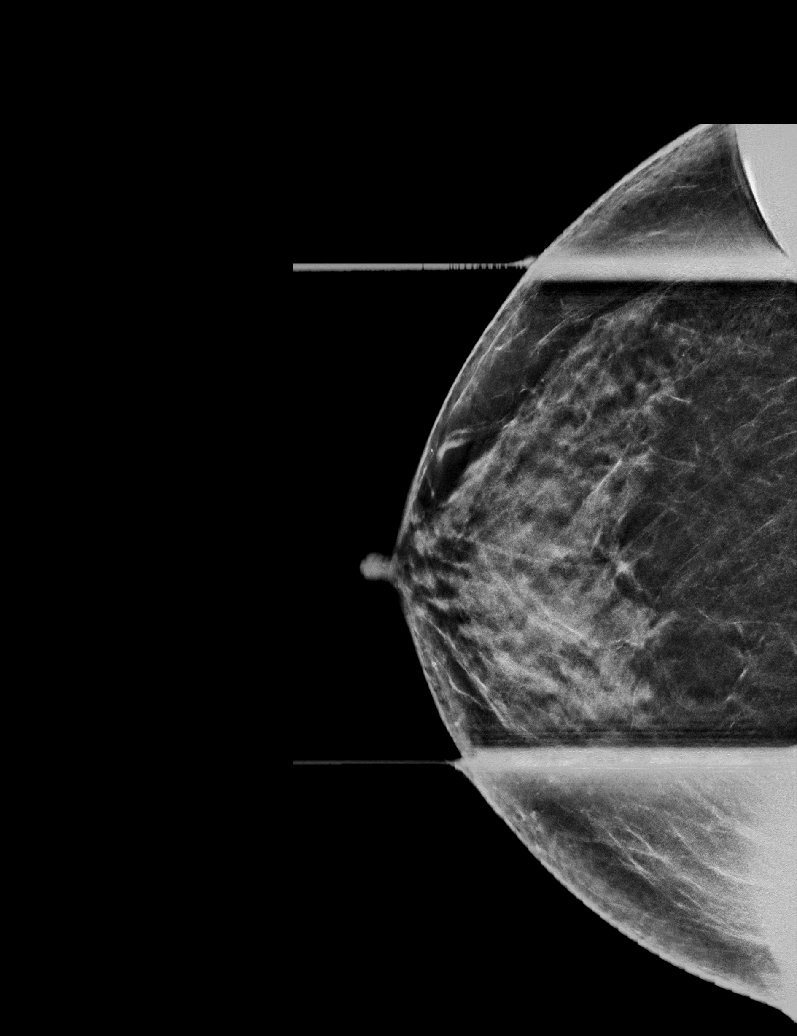

[R CC tomo · tomo slice 31/60.0]
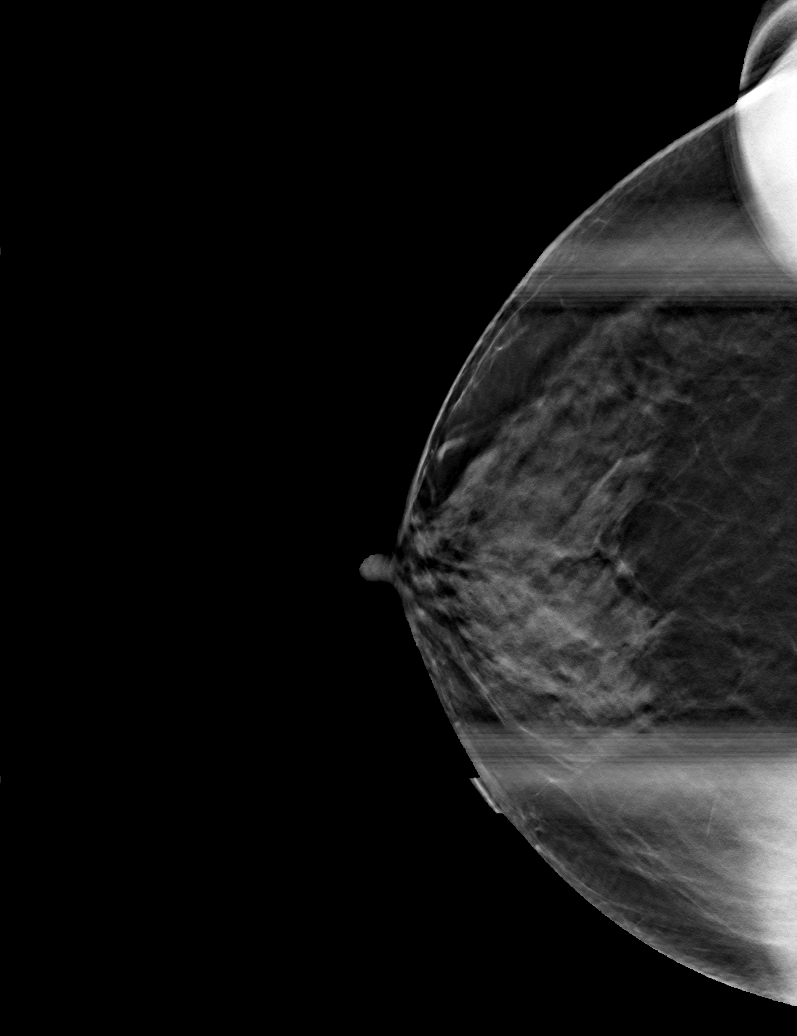

[R MLO tomo · tomo slice 31/60.0]
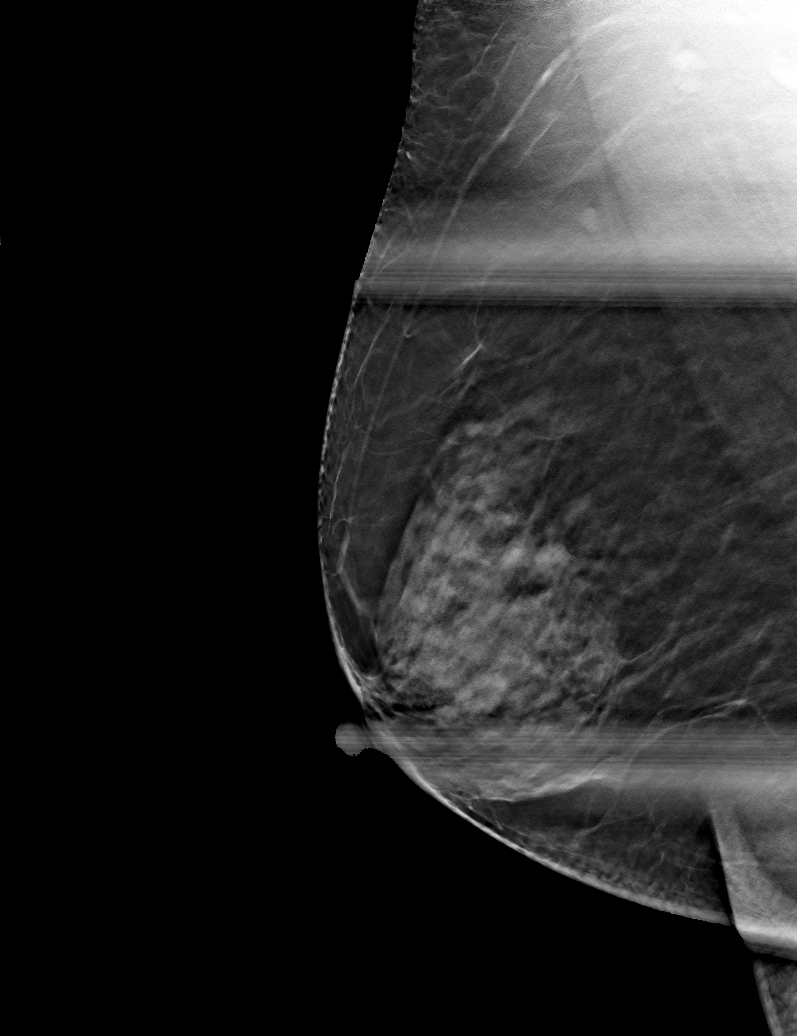

[R ML tomo · tomo slice 30/59.0]
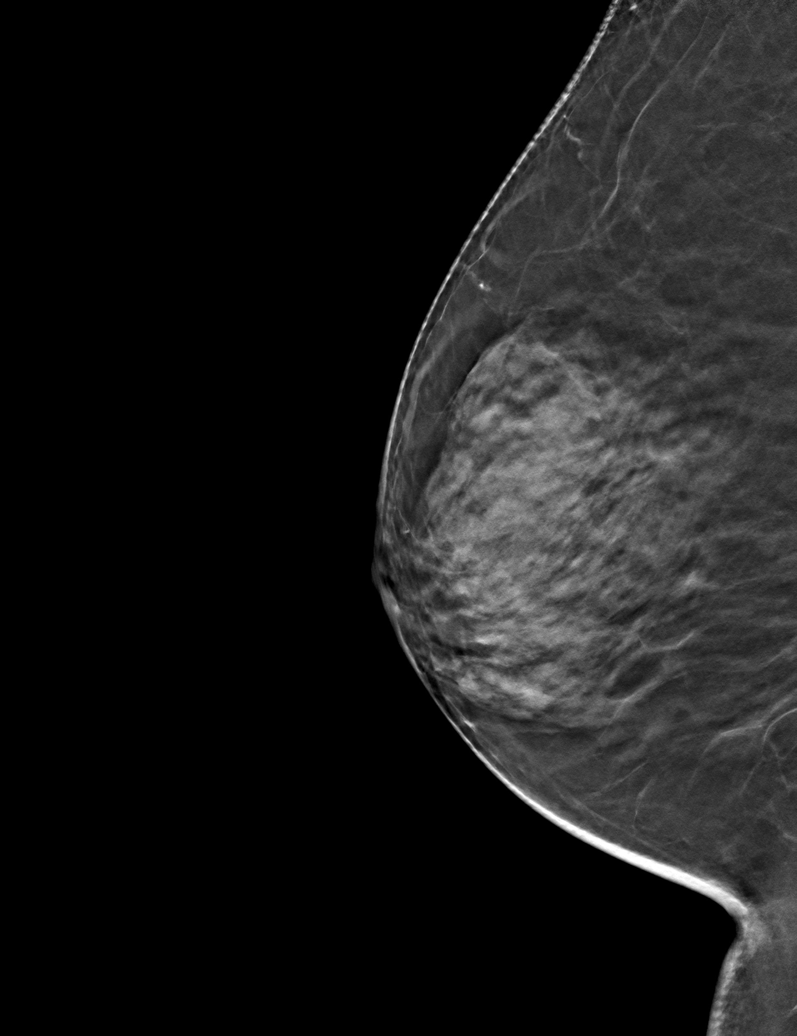

[6 of 18 positions shown; findings below may reference images not displayed]

ACR Breast Density Category c: The breast tissue is heterogeneously
dense, which may obscure small masses.
FINDINGS: Previously described, possible focal asymmetry in the upper outer
right breast at posterior depth resolves into well dispersed
fibroglandular tissue on additional views. No suspicious findings
identified.
IMPRESSION: No mammographic evidence of malignancy.

RECOMMENDATION:
Screening mammogram in one year.(Code:7Y-8-0PX)

I have discussed the findings and recommendations with the patient.
If applicable, a reminder letter will be sent to the patient
regarding the next appointment.

BI-RADS CATEGORY  1: Negative.

## 2022-09-06 DIAGNOSIS — E1165 Type 2 diabetes mellitus with hyperglycemia: Secondary | ICD-10-CM | POA: Diagnosis not present

## 2022-09-06 DIAGNOSIS — I10 Essential (primary) hypertension: Secondary | ICD-10-CM | POA: Diagnosis not present

## 2022-09-30 DIAGNOSIS — H524 Presbyopia: Secondary | ICD-10-CM | POA: Diagnosis not present

## 2022-09-30 DIAGNOSIS — E119 Type 2 diabetes mellitus without complications: Secondary | ICD-10-CM | POA: Diagnosis not present

## 2022-10-08 DIAGNOSIS — I10 Essential (primary) hypertension: Secondary | ICD-10-CM | POA: Diagnosis not present

## 2022-10-08 DIAGNOSIS — E1165 Type 2 diabetes mellitus with hyperglycemia: Secondary | ICD-10-CM | POA: Diagnosis not present

## 2022-10-27 NOTE — Progress Notes (Unsigned)
Cardiology Office Note:    Date:  10/29/2022   ID:  Lindsay Wilkerson, DOB 07/13/1946, MRN [id number]  PCP:  Lindsay Wilkerson, [place] Providers Cardiologist:  Freada Bergeron, MD {  Referring MD: Youlanda Roys, MD    History of Present Illness:    Lindsay Wilkerson is a 76 y.o. female with a hx of HTN, COPD and depression who was referred by Dr. Sherren Mocha for further evaluation of dyspnea on exertion.  Patient seen by Dr. Sherren Mocha on 08/29/22. Note reviewed. Reported 6 week history of dyspnea on exetion, diaphoresis and fatigue. Given symptoms, she is now referred to Cardiology for further evaluation.  Of note TTE in 2021 with LVEF 56%, moderate LVH, G2DD, trivial MR.   Today, the patient states that she has been noticing worsening SOB, diaphoresis, leg weakness, and fatigue with exertion. Specifically, she can be 70mn into housework and she feels like she needs to go and lay down. This has been ongoing for about 4-519month States that she has chronic dyspnea on exertion but the other symptoms were new. She did have one episode where she had pressure in her chest, but she cannot recall if this was with activity. No palpitations, LE edema, orthopnea, or PND.  No LE cramping. No known personal history of CVD.   Blood pressure is well controlled at home.   Prior smoker. Quit 21 years ago.   Past Medical History:  Diagnosis Date   Depression, major    Hypertension    Urinary bladder incontinence     Past Surgical History:  Procedure Laterality Date   COLONOSCOPY WITH PROPOFOL N/A 07/18/2022   Procedure: COLONOSCOPY WITH PROPOFOL;  Surgeon: CaHarvel QualeMD;  [facility];  Service: Gastroenterology;  Laterality: N/A;  1035 ASA 1   EYE SURGERY     IR FALLOPIAN TUBE CATHETERIZATION Left    1980   POLYPECTOMY  07/18/2022   Procedure: POLYPECTOMY;  Surgeon: CaMontez MoritaDaQuillian QuinceMD;  Location: [facility];  Service: Gastroenterology;;    TONSILLECTOMY     Age 47    Current Medications: Current Meds  Medication Sig   albuterol (VENTOLIN HFA) 108 (90 Base) MCG/ACT inhaler Inhale 2 puffs into the lungs every 6 (six) hours as needed for wheezing or shortness of breath.   atenolol-chlorthalidone (TENORETIC) 50-25 MG per tablet Take 1 tablet by mouth daily.     fluticasone (FLONASE) 50 MCG/ACT nasal spray Place 2 sprays into both nostrils daily.   metoprolol tartrate (LOPRESSOR) 100 MG tablet Take 1 tablet (100 mg total) by mouth as directed. Take 2 Hours Prior to CT Scan   potassium chloride SA (KLOR-CON M) 20 MEQ tablet Take 1 tablet (20 mEq total) by mouth daily.   TRELEGY ELLIPTA 100-62.5-25 MCG/ACT AEPB Inhale 1 puff into the lungs daily.     Allergies:   Amoxicillin, Augmentin [amoxicillin-pot clavulanate], Erythromycin, Sulfa antibiotics, Sulfasalazine, Tetanus toxoids, and Tussionex pennkinetic er [hydrocod poli-chlorphe poli er]   Social History   Socioeconomic History   Marital status: Divorced    Spouse name: Not on file   Number of children: Not on file   Years of education: Not on file   Highest education level: Not on file  Occupational History   Not on file  Tobacco Use   Smoking status: Former   Smokeless tobacco: Never  Vaping Use   Vaping Use: Never used  Substance and Sexual Activity   Alcohol use: Yes  Comment: occasionally   Drug use: No   Sexual activity: Not Currently    Birth control/protection: None, Post-menopausal  Other Topics Concern   Not on file  Social History Narrative   Not on file   Social Determinants of Health   Financial Resource Strain: Low Risk  (08/07/2021)   Overall Financial Resource Strain (CARDIA)    Difficulty of Paying Living Expenses: Not hard at all  Food Insecurity: No Food Insecurity (08/07/2021)   Hunger Vital Sign    Worried About Running Out of Food in the Last Year: Never true    Ran Out of Food in the Last Year: Never true  Transportation Needs:  No Transportation Needs (08/07/2021)   PRAPARE - Hydrologist (Medical): No    Lack of Transportation (Non-Medical): No  Physical Activity: Insufficiently Active (08/07/2021)   Exercise Vital Sign    Days of Exercise per Week: 1 day    Minutes of Exercise per Session: 30 min  Stress: Stress Concern Present (08/07/2021)   [place]    Feeling of Stress : To some extent  Social Connections: Moderately Integrated (08/07/2021)   Social Connection and Isolation Panel [NHANES]    Frequency of Communication with Friends and Family: More than three times a week    Frequency of Social Gatherings with Friends and Family: Three times a week    Attends Religious Services: More than 4 times per year    Active Member of Clubs or Organizations: Yes    Attends Music therapist: More than 4 times per year    Marital Status: Divorced     Family History: The patient's family history includes Breast cancer in her sister; Heart disease in her brother, father, and mother; Hypertension in her brother, mother, and sister; Kidney disease in her sister; Rheum arthritis in her sister.  ROS:   Please see the history of present illness.  All other systems reviewed and are negative.  EKGs/Labs/Other Studies Reviewed:    The following studies were reviewed today: TTE 03/14/20:  LEFT VENTRICLE                                      Anterior: Normal          Size: Normal                                 Lateral: Normal   Contraction: Normal                                  Septal: Normal    Closest EF: >55%(Estimated)  Calc.EF: 56% (3D)      Apical: Normal     LV masses: No Masses                             Inferior: Normal           LVH: MODERATE LVH CONCENTRIC              Posterior: Normal   LV GLS(AVG): -12.3% Normal Range [ <= -16]  Dias.FxClass: RELAXATION ABNORMALITY (GRADE 2) CORRESPONDS TO PSEUDONORMAL    MITRAL VALVE  Leaflets: Normal                  Mobility: Fully mobile    Morphology: Normal   LEFT ATRIUM          Size: MODERATELY ENLARGED     LA masses: No masses                Normal IAS   MAIN PA          Size: Normal   PULMONIC VALVE    Morphology: Normal      Mobility: Fully Mobile   RIGHT VENTRICLE          Size: Normal                    Free wall: Normal   Contraction: Normal                    RV masses: No Masses         TAPSE:   1.6 cm,  Normal Range [>= 1.6 cm]   TRICUSPID VALVE      Leaflets: Normal                  Mobility: Fully mobile    Morphology: Normal   RIGHT ATRIUM          Size: MILDLY ENLARGED            RA Other: None     RA masses: EUSTACHIAN VALVE   PERICARDIUM        Fluid: No effusion   INFERIOR VENACAVA          Size: Normal     Normal respiratory collapse   DOPPLER ECHO and OTHER SPECIAL PROCEDURES ------------------------------------     Aortic: No AR                  No AS      Mitral: TRIVIAL MR             No MS     MV Inflow E Vel.= 71.0 cm/s  MV Annulus E'Vel.= 4.3 cm/s  E/E'Ratio= 17   Tricuspid: TRIVIAL TR             No TS             2.8 m/s peak TR vel   34 mmHg peak RV pressure   Pulmonary: TRIVIAL PR             No PS       Other:   INTERPRETATION ---------------------------------------------------------------    NORMAL LEFT VENTRICULAR SYSTOLIC FUNCTION WITH MODERATE LVH    NORMAL RIGHT VENTRICULAR SYSTOLIC FUNCTION    VALVULAR REGURGITATION: TRIVIAL MR, TRIVIAL PR, TRIVIAL TR    NO VALVULAR STENOSIS   EKG: The ekg ordered 07/30/22 personally reviewed and demonstrates NSR, possible old anterior infarct (q in V2).   Recent Labs: 07/30/2022: ALT 14; BUN 17; Creatinine, Ser 1.01; Hemoglobin 13.4; Magnesium 1.8; Platelets 256; Potassium 2.9; Sodium 139  Recent Lipid Panel    Component Value Date/Time   CHOL 131 03/13/2021 1108   TRIG 88 03/13/2021 1108   HDL 37 (L) 03/13/2021 1108   CHOLHDL 3.5  03/13/2021 1108   VLDL 18 03/13/2021 1108   LDLCALC 76 03/13/2021 1108     Risk Assessment/Calculations:                Physical Exam:    VS:  BP 130/70   Pulse 78  Ht '5\' 8"'$  (1.727 m)   Wt 213 lb 3.2 oz (96.7 kg)   SpO2 95%   BMI 32.42 kg/m     Wt Readings from Last 3 Encounters:  10/29/22 213 lb 3.2 oz (96.7 kg)  07/30/22 215 lb (97.5 kg)  07/12/22 214 lb 15.2 oz (97.5 kg)     GEN:  Well nourished, well developed in no acute distress HEENT: Normal NECK: No JVD; No carotid bruits CARDIAC: RRR, 1/6 systolic murmur. No rubs, gallops RESPIRATORY:  Clear to auscultation without rales, wheezing or rhonchi  ABDOMEN: Soft, non-tender, non-distended MUSCULOSKELETAL:  No edema; No deformity  SKIN: Warm and dry NEUROLOGIC:  Alert and oriented x 3 PSYCHIATRIC:  Normal affect   ASSESSMENT:    1. Chest pain, unspecified type   2. SOB (shortness of breath)   3. Primary hypertension   4. Chronic obstructive pulmonary disease, unspecified COPD type ([place])    PLAN:    In order of problems listed above:  #DOE: #Fatigue with Exertion: #Chest Pain: Has been ongoing for the past 4-5 months with worsening dyspnea, fatigue, diaphoresis and one episode of chest pressure. Symptoms resolve with rest. No known history of CVD but given exertional symptoms, concern for possible angina.  Will check TTE and coronary CTA for further evaluation. -Check coronary CTA -Check TTE  #HTN: BP well controlled. -Continue antenolol-chlorthalidone 50-'25mg'$  daily  #COPD: -On home inhalers         Medication Adjustments/Labs and Tests Ordered: Current medicines are reviewed at length with the patient today.  Concerns regarding medicines are outlined above.  Orders Placed This Encounter  Procedures   CT CORONARY MORPH W/CTA COR W/SCORE W/CA W/CM &/OR WO/CM   Basic Metabolic Panel (BMET)   ECHOCARDIOGRAM COMPLETE   Meds ordered this encounter  Medications   metoprolol tartrate  (LOPRESSOR) 100 MG tablet    Sig: Take 1 tablet (100 mg total) by mouth as directed. Take 2 Hours Prior to CT Scan    Dispense:  1 tablet    Refill:  0    Patient Instructions  . Medication Instructions:  Take Lopressor 100 mg Two Hours Prior to CT   *If you need a refill on your cardiac medications before your next appointment, please call your pharmacy*   Lab Work: Your physician recommends that you return for lab work in: BMET  If you have labs (blood work) drawn today and your tests are completely normal, you will receive your results only by: [employer] (if you have MyChart) OR A paper copy in the mail If you have any lab test that is abnormal or we need to change your treatment, we will call you to review the results.   Testing/Procedures: Your physician has requested that you have an echocardiogram. Echocardiography is a painless test that uses sound waves to create images of your heart. It provides your doctor with information about the size and shape of your heart and how well your heart's chambers and valves are working. This procedure takes approximately one hour. There are no restrictions for this procedure. Please do NOT wear cologne, perfume, aftershave, or lotions (deodorant is allowed). Please arrive 15 minutes prior to your appointment time.    Your cardiac CT will be scheduled at one of the below locations:   [facility] [place] [address] [place], [place] [address] [phone]  [place] [address] [place], [place] [address] [phone]  OR   [place]  [facility] [place], [place] [address] [phone]  If scheduled at [facility], please arrive at the [facility] and Children's Entrance (Entrance C2) of [facility] 30 minutes prior to test start time. You can use the FREE valet parking offered at entrance C (encouraged to control  the heart rate for the test)  Proceed to the [facility] Radiology Department (first floor) to check-in and test prep.  All radiology patients and guests should use entrance C2 at [facility], accessed from [facility], even though the hospital's physical address listed is [address]    If scheduled at [facility] or [facility], please arrive 15 mins early for check-in and test prep.   Please follow these instructions carefully (unless otherwise directed):  Hold all erectile dysfunction medications at least 3 days (72 hrs) prior to test. (Ie viagra, cialis, sildenafil, tadalafil, etc) We will administer nitroglycerin during this exam.   On the Night Before the Test: Be sure to Drink plenty of water. Do not consume any caffeinated/decaffeinated beverages or chocolate 12 hours prior to your test. Do not take any antihistamines 12 hours prior to your test. If the patient has contrast allergy: Patient will need a prescription for Prednisone and very clear instructions (as follows): Prednisone 50 mg - take 13 hours prior to test Take another Prednisone 50 mg 7 hours prior to test Take another Prednisone 50 mg 1 hour prior to test Take Benadryl 50 mg 1 hour prior to test Patient must complete all four doses of above prophylactic medications. Patient will need a ride after test due to Benadryl.  On the Day of the Test: Drink plenty of water until 1 hour prior to the test. Do not eat any food 1 hour prior to test. You may take your regular medications prior to the test.  Take metoprolol (Lopressor) two hours prior to test. HOLD Furosemide/Hydrochlorothiazide morning of the test. FEMALES- please wear underwire-free bra if available, avoid dresses & tight clothing   *For Clinical Staff only. Please instruct patient the following:* Heart Rate Medication Recommendations for Cardiac CT  Resting HR < 50 bpm  No  medication  Resting HR 50-60 bpm and BP >110/50 mmHG   Consider Metoprolol tartrate 25 mg PO 90-120 min prior to scan  Resting HR 60-65 bpm and BP >110/50 mmHG  Metoprolol tartrate 50 mg PO 90-120 minutes prior to scan   Resting HR > 65 bpm and BP >110/50 mmHG  Metoprolol tartrate 100 mg PO 90-120 minutes prior to scan  Consider Ivabradine 10-15 mg PO or a calcium channel blocker for resting HR >60 bpm and contraindication to metoprolol tartrate  Consider Ivabradine 10-15 mg PO in combination with metoprolol tartrate for HR >80 bpm         After the Test: Drink plenty of water. After receiving IV contrast, you may experience a mild flushed feeling. This is normal. On occasion, you may experience a mild rash up to 24 hours after the test. This is not dangerous. If this occurs, you can take Benadryl 25 mg and increase your fluid intake. If you experience trouble breathing, this can be serious. If it is severe call 911 IMMEDIATELY. If it is mild, please call our office. If you take any of these medications: Glipizide/Metformin, Avandament, Glucavance, please do not take 48 hours after completing test unless otherwise instructed.  We will call to schedule your test 2-4 weeks out  understanding that some insurance companies will need an authorization prior to the service being performed.   For non-scheduling related questions, please contact the cardiac imaging nurse navigator should you have any questions/concerns: Marchia Bond, Cardiac Imaging Nurse Navigator Gordy Clement, Cardiac Imaging Nurse Navigator [place] Direct Office Dial: [phone]   For scheduling needs, including cancellations and rescheduling, please call [place], [phone].    Follow-Up: At [facility], you and your health needs are our priority.  As part of our continuing mission to provide you with exceptional heart care, we have created designated Provider Care Teams.  These  Care Teams include your primary Cardiologist (physician) and Advanced Practice Providers (APPs -  Physician Assistants and Nurse Practitioners) who all work together to provide you with the care you need, when you need it.  We recommend signing up for the patient portal called "MyChart".  Sign up information is provided on this After Visit Summary.  MyChart is used to connect with patients for Virtual Visits (Telemedicine).  Patients are able to view lab/test results, encounter notes, upcoming appointments, etc.  Non-urgent messages can be sent to your provider as well.   To learn more about what you can do with MyChart, go to [email or web address].    Your next appointment:   6 month(s)  The format for your next appointment:   In Person  Provider:   You may see Freada Bergeron, MD or one of the following Advanced Practice Providers on your designated Care Team:   Bernerd Pho, PA-C  Ermalinda Barrios, PA-C     Other Instructions Thank you for choosing [facility] [place]!    Important Information About Sugar         Signed, Freada Bergeron, MD  10/29/2022 11:47 AM    [place]

## 2022-10-29 ENCOUNTER — Encounter: Payer: Self-pay | Admitting: Cardiology

## 2022-10-29 ENCOUNTER — Ambulatory Visit: Payer: Medicare PPO | Attending: Cardiology | Admitting: Cardiology

## 2022-10-29 VITALS — BP 130/70 | HR 78 | Ht 68.0 in | Wt 213.2 lb

## 2022-10-29 DIAGNOSIS — J449 Chronic obstructive pulmonary disease, unspecified: Secondary | ICD-10-CM

## 2022-10-29 DIAGNOSIS — I10 Essential (primary) hypertension: Secondary | ICD-10-CM | POA: Diagnosis not present

## 2022-10-29 DIAGNOSIS — R079 Chest pain, unspecified: Secondary | ICD-10-CM | POA: Diagnosis not present

## 2022-10-29 DIAGNOSIS — R0602 Shortness of breath: Secondary | ICD-10-CM

## 2022-10-29 MED ORDER — METOPROLOL TARTRATE 100 MG PO TABS: 100.0000 mg | ORAL_TABLET | ORAL | 0 refills | Status: DC

## 2022-10-29 NOTE — Patient Instructions (Signed)
. Medication Instructions:  Take Lopressor 100 mg Two Hours Prior to CT   *If you need a refill on your cardiac medications before your next appointment, please call your pharmacy*   Lab Work: Your physician recommends that you return for lab work in: BMET  If you have labs (blood work) drawn today and your tests are completely normal, you will receive your results only by: [employer] (if you have MyChart) OR A paper copy in the mail If you have any lab test that is abnormal or we need to change your treatment, we will call you to review the results.   Testing/Procedures: Your physician has requested that you have an echocardiogram. Echocardiography is a painless test that uses sound waves to create images of your heart. It provides your doctor with information about the size and shape of your heart and how well your heart's chambers and valves are working. This procedure takes approximately one hour. There are no restrictions for this procedure. Please do NOT wear cologne, perfume, aftershave, or lotions (deodorant is allowed). Please arrive 15 minutes prior to your appointment time.    Your cardiac CT will be scheduled at one of the below locations:   [facility] [address] [place], [place] [address] (336) [place] [address] [place], [place] [address] [phone]  [facility] [place], [place] [address] [phone]  If scheduled at [facility], please arrive at the [facility] and Children's Entrance (Entrance C2) of [facility] 30 minutes prior to test start time. You can use the FREE valet parking offered at entrance C (encouraged to control the heart rate for the test)  Proceed to the [facility] Radiology Department (first floor) to check-in and test prep.  All radiology patients and guests should use entrance C2 at  [facility], accessed from [facility], even though the hospital's physical address listed is [address]    If scheduled at [facility] or [facility], please arrive 15 mins early for check-in and test prep.   Please follow these instructions carefully (unless otherwise directed):  Hold all erectile dysfunction medications at least 3 days (72 hrs) prior to test. (Ie viagra, cialis, sildenafil, tadalafil, etc) We will administer nitroglycerin during this exam.   On the Night Before the Test: Be sure to Drink plenty of water. Do not consume any caffeinated/decaffeinated beverages or chocolate 12 hours prior to your test. Do not take any antihistamines 12 hours prior to your test. If the patient has contrast allergy: Patient will need a prescription for Prednisone and very clear instructions (as follows): Prednisone 50 mg - take 13 hours prior to test Take another Prednisone 50 mg 7 hours prior to test Take another Prednisone 50 mg 1 hour prior to test Take Benadryl 50 mg 1 hour prior to test Patient must complete all four doses of above prophylactic medications. Patient will need a ride after test due to Benadryl.  On the Day of the Test: Drink plenty of water until 1 hour prior to the test. Do not eat any food 1 hour prior to test. You may take your regular medications prior to the test.  Take metoprolol (Lopressor) two hours prior to test. HOLD Furosemide/Hydrochlorothiazide morning of the test. FEMALES- please wear underwire-free bra if available, avoid dresses & tight clothing   *For Clinical  Staff only. Please instruct patient the following:* Heart Rate Medication Recommendations for Cardiac CT  Resting HR < 50 bpm  No medication  Resting HR 50-60 bpm and BP >110/50 mmHG   Consider Metoprolol tartrate 25 mg PO 90-120 min prior to scan  Resting HR 60-65 bpm and BP >110/50 mmHG  Metoprolol  tartrate 50 mg PO 90-120 minutes prior to scan   Resting HR > 65 bpm and BP >110/50 mmHG  Metoprolol tartrate 100 mg PO 90-120 minutes prior to scan  Consider Ivabradine 10-15 mg PO or a calcium channel blocker for resting HR >60 bpm and contraindication to metoprolol tartrate  Consider Ivabradine 10-15 mg PO in combination with metoprolol tartrate for HR >80 bpm         After the Test: Drink plenty of water. After receiving IV contrast, you may experience a mild flushed feeling. This is normal. On occasion, you may experience a mild rash up to 24 hours after the test. This is not dangerous. If this occurs, you can take Benadryl 25 mg and increase your fluid intake. If you experience trouble breathing, this can be serious. If it is severe call 911 IMMEDIATELY. If it is mild, please call our office. If you take any of these medications: Glipizide/Metformin, Avandament, Glucavance, please do not take 48 hours after completing test unless otherwise instructed.  We will call to schedule your test 2-4 weeks out understanding that some insurance companies will need an authorization prior to the service being performed.   For non-scheduling related questions, please contact the cardiac imaging nurse navigator should you have any questions/concerns: Marchia Bond, Cardiac Imaging Nurse Navigator Gordy Clement, Cardiac Imaging Nurse Navigator [place] [facility] Direct Office Dial: [phone]   For scheduling needs, including cancellations and rescheduling, please call [place], [phone].    Follow-Up: At [facility], you and your health needs are our priority.  As part of our continuing mission to provide you with exceptional heart care, we have created designated Provider Care Teams.  These Care Teams include your primary Cardiologist (physician) and Advanced Practice Providers (APPs -  Physician Assistants and Nurse Practitioners) who all work together to  provide you with the care you need, when you need it.  We recommend signing up for the patient portal called "MyChart".  Sign up information is provided on this After Visit Summary.  MyChart is used to connect with patients for Virtual Visits (Telemedicine).  Patients are able to view lab/test results, encounter notes, upcoming appointments, etc.  Non-urgent messages can be sent to your provider as well.   To learn more about what you can do with MyChart, go to [email or web address].    Your next appointment:   6 month(s)  The format for your next appointment:   In Person  Provider:   You may see Freada Bergeron, MD or one of the following Advanced Practice Providers on your designated Care Team:   Bernerd Pho, PA-C  Ermalinda Barrios, PA-C     Other Instructions Thank you for choosing [facility]!    Important Information About Sugar

## 2022-10-31 ENCOUNTER — Encounter (HOSPITAL_COMMUNITY): Payer: Self-pay

## 2022-10-31 ENCOUNTER — Emergency Department (HOSPITAL_COMMUNITY)
Admission: EM | Admit: 2022-10-31 | Discharge: 2022-10-31 | Disposition: A | Discharge status: Home / Self Care | Payer: Medicare PPO | Attending: Emergency Medicine | Admitting: Emergency Medicine

## 2022-10-31 ENCOUNTER — Other Ambulatory Visit: Payer: Self-pay

## 2022-10-31 DIAGNOSIS — E876 Hypokalemia: Secondary | ICD-10-CM

## 2022-10-31 DIAGNOSIS — R040 Epistaxis: Secondary | ICD-10-CM | POA: Diagnosis not present

## 2022-10-31 LAB — BASIC METABOLIC PANEL WITH GFR
Anion gap: 6 (ref 5–15)
BUN: 19 mg/dL (ref 8–23)
CO2: 29 mmol/L (ref 22–32)
Calcium: 9 mg/dL (ref 8.9–10.3)
Chloride: 107 mmol/L (ref 98–111)
Creatinine, Ser: 1.22 mg/dL — ABNORMAL HIGH (ref 0.44–1.00)
GFR, Estimated: 46 mL/min — ABNORMAL LOW
Glucose, Bld: 138 mg/dL — ABNORMAL HIGH (ref 70–99)
Potassium: 3.2 mmol/L — ABNORMAL LOW (ref 3.5–5.1)
Sodium: 142 mmol/L (ref 135–145)

## 2022-10-31 LAB — CBC
HCT: 41.2 % (ref 36.0–46.0)
Hemoglobin: 13.1 g/dL (ref 12.0–15.0)
MCH: 27.4 pg (ref 26.0–34.0)
MCHC: 31.8 g/dL (ref 30.0–36.0)
MCV: 86.2 fL (ref 80.0–100.0)
Platelets: 269 K/uL (ref 150–400)
RBC: 4.78 MIL/uL (ref 3.87–5.11)
RDW: 14.2 % (ref 11.5–15.5)
WBC: 5.4 K/uL (ref 4.0–10.5)
nRBC: 0 % (ref 0.0–0.2)

## 2022-10-31 MED ORDER — POTASSIUM CHLORIDE CRYS ER 20 MEQ PO TBCR
40.0000 meq | EXTENDED_RELEASE_TABLET | Freq: Once | ORAL | Status: AC
  Administered 2022-10-31: 40 meq via ORAL
  Filled 2022-10-31: qty 2

## 2022-10-31 NOTE — Discharge Instructions (Addendum)
It was our pleasure to provide your ER care today - we hope that you feel better.  Avoid blowing nose, sneezing nose, or rubbing nose for the next couple of days. Considered using nasal saline drops and/or humidifier in the room where you sleep to help keep nasal mucosa moist.  If bleeding recurs, hold directly pressure/pinch nose for 10 minutes without letting up - if bleeding persists, return for recheck.   Overall your labs and blood counts look good/normal. Your potassium level is mildly low - eat plenty of fruits and vegetables, and follow up with primary care doctor.   For nosebleeding, follow up with ENT doctor in the next 1-2 weeks (or sooner if recurrent bleeding).   Return to ER if worse, new symptoms, fevers, trouble breathing, persistent bleeding, or other concern.

## 2022-10-31 NOTE — ED Provider Notes (Signed)
[facility] EMERGENCY DEPARTMENT Provider Note   CSN: [id number] Arrival date & time: 10/31/22  1930     History  Chief Complaint  Patient presents with   Epistaxis    Lindsay Wilkerson is a 76 y.o. female.  Pt with c/o nosebleeding intermittently in past few days. Symptoms acute onset, moderate, now improved. States spit out some blood earlier in week. No hemoptysis or new/increased cough. No sore throat. No trouble breathing or swallowing. No vomiting. No faintness or dizziness. No anticoagulant use. No other abnormal bruising or bleeding.   The history is provided by the patient, medical records and a relative.  Epistaxis Associated symptoms: no cough and no fever        Home Medications Prior to Admission medications   Medication Sig Start Date End Date Taking? Authorizing Provider  albuterol (VENTOLIN HFA) 108 (90 Base) MCG/ACT inhaler Inhale 2 puffs into the lungs every 6 (six) hours as needed for wheezing or shortness of breath. 12/22/21   Volney American, PA-C  atenolol-chlorthalidone (TENORETIC) 50-25 MG per tablet Take 1 tablet by mouth daily.      [provider]  benzonatate (TESSALON) 100 MG capsule Take 1 capsule (100 mg total) by mouth 3 (three) times daily as needed for cough. Patient not taking: Reported on 10/29/2022 07/01/22   Jaynee Eagles, PA-C  cetirizine (ZYRTEC) 5 MG tablet Take 1 tablet (5 mg total) by mouth daily. Patient not taking: Reported on 10/29/2022 07/01/22   Jaynee Eagles, PA-C  clonazePAM (KLONOPIN) 0.5 MG tablet Take 0.5 tablets by mouth daily as needed (anxiety). Patient not taking: Reported on 10/29/2022    [provider]  fluticasone (FLONASE) 50 MCG/ACT nasal spray Place 2 sprays into both nostrils daily.    [provider]  metoprolol tartrate (LOPRESSOR) 100 MG tablet Take 1 tablet (100 mg total) by mouth as directed. Take 2 Hours Prior to CT Scan 10/29/22 01/27/23  Freada Bergeron, MD  potassium chloride SA  (KLOR-CON M) 20 MEQ tablet Take 1 tablet (20 mEq total) by mouth daily. 07/30/22   Hayden Rasmussen, MD  TRELEGY ELLIPTA 100-62.5-25 MCG/ACT AEPB Inhale 1 puff into the lungs daily. 01/03/22   [provider]      Allergies    Amoxicillin, Augmentin [amoxicillin-pot clavulanate], Erythromycin, Sulfa antibiotics, Sulfasalazine, Tetanus toxoids, and Tussionex pennkinetic er [hydrocod poli-chlorphe poli er]    Review of Systems   Review of Systems  Constitutional:  Negative for fever.  HENT:  Positive for nosebleeds.   Respiratory:  Negative for cough and shortness of breath.   Cardiovascular:  Negative for chest pain.  Gastrointestinal:  Negative for nausea and vomiting.  Genitourinary:  Negative for hematuria.  Skin:  Negative for pallor and rash.  Neurological:  Negative for light-headedness.    Physical Exam Updated Vital Signs BP 136/77   Pulse 73   Temp 98.3 F (36.8 C) (Oral)   Resp 18   Ht 1.727 m ('5\' 8"'$ )   Wt 96.6 kg   SpO2 99%   BMI 32.39 kg/m  Physical Exam Vitals and nursing note reviewed.  Constitutional:      Appearance: Normal appearance. She is well-developed.  HENT:     Head: Atraumatic.     Nose:     Comments: Tiny amt fresh blood bilateral anterior nares. Left nasal polyp. No active bleeding anteriorly or posteriorly.     Mouth/Throat:     Mouth: Mucous membranes are moist.     Pharynx: Oropharynx is  clear.     Comments: No blood.  Eyes:     General: No scleral icterus.    Conjunctiva/sclera: Conjunctivae normal.  Neck:     Trachea: No tracheal deviation.  Cardiovascular:     Rate and Rhythm: Normal rate.     Pulses: Normal pulses.  Pulmonary:     Effort: Pulmonary effort is normal. No respiratory distress.     Breath sounds: Normal breath sounds.  Musculoskeletal:        General: No swelling.     Cervical back: Normal range of motion and neck supple. No rigidity. No muscular tenderness.  Lymphadenopathy:     Cervical: No cervical  adenopathy.  Skin:    General: Skin is warm and dry.     Coloration: Skin is not pale.     Findings: No rash.  Neurological:     Mental Status: She is alert.     Comments: Alert, speech normal.   Psychiatric:        Mood and Affect: Mood normal.     ED Results / Procedures / Treatments   Labs (all labs ordered are listed, but only abnormal results are displayed) Results for orders placed or performed during the hospital encounter of 10/31/22  CBC  Result Value Ref Range   WBC 5.4 4.0 - 10.5 K/uL   RBC 4.78 3.87 - 5.11 MIL/uL   Hemoglobin 13.1 12.0 - 15.0 g/dL   HCT 41.2 36.0 - 46.0 %   MCV 86.2 80.0 - 100.0 fL   MCH 27.4 26.0 - 34.0 pg   MCHC 31.8 30.0 - 36.0 g/dL   RDW 14.2 11.5 - 15.5 %   Platelets 269 150 - 400 K/uL   nRBC 0.0 0.0 - 0.2 %  Basic metabolic panel  Result Value Ref Range   Sodium 142 135 - 145 mmol/L   Potassium 3.2 (L) 3.5 - 5.1 mmol/L   Chloride 107 98 - 111 mmol/L   CO2 29 22 - 32 mmol/L   Glucose, Bld 138 (H) 70 - 99 mg/dL   BUN 19 8 - 23 mg/dL   Creatinine, Ser 1.22 (H) 0.44 - 1.00 mg/dL   Calcium 9.0 8.9 - 10.3 mg/dL   GFR, Estimated 46 (L) >60 mL/min   Anion gap 6 5 - 15   EKG None  Radiology No results found.  Procedures Procedures    Medications Ordered in ED Medications  potassium chloride SA (KLOR-CON M) CR tablet 40 mEq (has no administration in time range)    ED Course/ Medical Decision Making/ A&P                           Medical Decision Making Problems Addressed: Anterior epistaxis: acute illness or injury Hypokalemia: acute illness or injury  Amount and/or Complexity of Data Reviewed External Data Reviewed: notes. Labs: ordered. Decision-making details documented in ED Course.  Risk Prescription drug management.   Labs sent. Pt observed.   Reviewed nursing notes and prior charts for additional history.   Recheck, no bleeding anterior/posteriorly.  Labs reviewed/interpreted by me - hgb and plt normal.  K  sl low. Kcl po.   Pt appears stable for d/c. Rec ent f/u.   Return precautions provided.          Final Clinical Impression(s) / ED Diagnoses Final diagnoses:  Anterior epistaxis    Rx / DC Orders ED Discharge Orders     None  Lajean Saver, MD 11/01/22 [phone]

## 2022-10-31 NOTE — ED Triage Notes (Addendum)
Pt reports nosebleed and spitting up blood, pt says she coughed up "blood clot" last week but didn't have nosebleed, pt said after the clot, she just saw streaks in sputum. Pt is not on blood thinners. No active bleeding at this time.

## 2022-11-08 DIAGNOSIS — E1165 Type 2 diabetes mellitus with hyperglycemia: Secondary | ICD-10-CM | POA: Diagnosis not present

## 2022-11-08 DIAGNOSIS — I10 Essential (primary) hypertension: Secondary | ICD-10-CM | POA: Diagnosis not present

## 2022-11-13 ENCOUNTER — Ambulatory Visit (HOSPITAL_COMMUNITY)
Admission: RE | Admit: 2022-11-13 | Discharge: 2022-11-13 | Disposition: A | Discharge status: Home / Self Care | Payer: Medicare PPO | Source: Ambulatory Visit | Attending: Cardiology | Admitting: Cardiology

## 2022-11-13 DIAGNOSIS — R079 Chest pain, unspecified: Secondary | ICD-10-CM | POA: Insufficient documentation

## 2022-11-13 DIAGNOSIS — R0602 Shortness of breath: Secondary | ICD-10-CM | POA: Insufficient documentation

## 2022-11-13 LAB — ECHOCARDIOGRAM COMPLETE
AR max vel: 2.6 cm2
AV Area VTI: 2.69 cm2
AV Area mean vel: 2.7 cm2
AV Mean grad: 3 mmHg
AV Peak grad: 6 mmHg
Ao pk vel: 1.22 m/s
Area-P 1/2: 3.6 cm2
MV VTI: 3.16 cm2
S' Lateral: 2.9 cm

## 2022-11-13 NOTE — Progress Notes (Incomplete)
*  PRELIMINARY RESULTS* Echocardiogram 2D Echocardiogram has been performed.  Lindsay Wilkerson 11/13/2022, 12:33 PM

## 2022-11-15 ENCOUNTER — Ambulatory Visit (HOSPITAL_COMMUNITY): Admission: RE | Admit: 2022-11-15 | Payer: Medicare PPO | Source: Ambulatory Visit

## 2022-11-19 ENCOUNTER — Telehealth: Payer: Self-pay

## 2022-11-19 DIAGNOSIS — R931 Abnormal findings on diagnostic imaging of heart and coronary circulation: Secondary | ICD-10-CM

## 2022-11-19 NOTE — Telephone Encounter (Signed)
-----   Message from Freada Bergeron, MD sent at 11/13/2022  7:28 PM EST ----- Her echo shows vigorous pumping function. Her ventricle is moderately thickened. Given the degree of her heart muscle thickening, symptoms, and abnormal ECG, can we get a cardiac MRI to assess further? This will allow [place] to evaluate the heart muscle further and see if there is a cause to her heart muscle thickening.

## 2022-11-19 NOTE — Telephone Encounter (Signed)
Echo results discussed with patient and she agrees to have cardiac MRI done.Order placed. CBC done 10/31/22.

## 2022-11-22 ENCOUNTER — Telehealth (HOSPITAL_COMMUNITY): Payer: Self-pay | Admitting: Emergency Medicine

## 2022-11-22 NOTE — Telephone Encounter (Signed)
Reaching out to patient to offer assistance regarding upcoming cardiac imaging study; pt verbalizes understanding of appt date/time, parking situation and where to check in, pre-test NPO status and medications ordered, and verified current allergies; name and call back number provided for further questions should they arise Lindsay Bond RN Navigator Cardiac Imaging [facility] [phone] office [phone] cell  Arrival 1230 '100mg'$  metoprolol  Denies iv issues

## 2022-11-25 ENCOUNTER — Ambulatory Visit (HOSPITAL_COMMUNITY)
Admission: RE | Admit: 2022-11-25 | Discharge: 2022-11-25 | Disposition: A | Discharge status: Home / Self Care | Payer: Medicare PPO | Source: Ambulatory Visit | Attending: Cardiology | Admitting: Cardiology

## 2022-11-25 DIAGNOSIS — I517 Cardiomegaly: Secondary | ICD-10-CM | POA: Insufficient documentation

## 2022-11-25 DIAGNOSIS — I2510 Atherosclerotic heart disease of native coronary artery without angina pectoris: Secondary | ICD-10-CM

## 2022-11-25 DIAGNOSIS — R079 Chest pain, unspecified: Secondary | ICD-10-CM | POA: Insufficient documentation

## 2022-11-25 MED ORDER — IOHEXOL 350 MG/ML SOLN
95.0000 mL | Freq: Once | INTRAVENOUS | Status: AC | PRN
  Administered 2022-11-25: 95 mL via INTRAVENOUS

## 2022-11-25 MED ORDER — NITROGLYCERIN 0.4 MG SL SUBL
SUBLINGUAL_TABLET | SUBLINGUAL | Status: AC
  Filled 2022-11-25: qty 2

## 2022-11-25 MED ORDER — NITROGLYCERIN 0.4 MG SL SUBL
0.8000 mg | SUBLINGUAL_TABLET | Freq: Once | SUBLINGUAL | Status: AC
  Administered 2022-11-25: 0.8 mg via SUBLINGUAL

## 2022-12-02 DIAGNOSIS — E119 Type 2 diabetes mellitus without complications: Secondary | ICD-10-CM | POA: Diagnosis not present

## 2022-12-02 DIAGNOSIS — F419 Anxiety disorder, unspecified: Secondary | ICD-10-CM | POA: Diagnosis not present

## 2022-12-02 DIAGNOSIS — E1165 Type 2 diabetes mellitus with hyperglycemia: Secondary | ICD-10-CM | POA: Diagnosis not present

## 2022-12-02 DIAGNOSIS — K219 Gastro-esophageal reflux disease without esophagitis: Secondary | ICD-10-CM | POA: Diagnosis not present

## 2022-12-02 DIAGNOSIS — I700 Atherosclerosis of aorta: Secondary | ICD-10-CM | POA: Diagnosis not present

## 2022-12-02 DIAGNOSIS — I10 Essential (primary) hypertension: Secondary | ICD-10-CM | POA: Diagnosis not present

## 2023-01-02 DIAGNOSIS — E119 Type 2 diabetes mellitus without complications: Secondary | ICD-10-CM | POA: Diagnosis not present

## 2023-01-02 DIAGNOSIS — I10 Essential (primary) hypertension: Secondary | ICD-10-CM | POA: Diagnosis not present

## 2023-02-02 DIAGNOSIS — I10 Essential (primary) hypertension: Secondary | ICD-10-CM | POA: Diagnosis not present

## 2023-02-02 DIAGNOSIS — E119 Type 2 diabetes mellitus without complications: Secondary | ICD-10-CM | POA: Diagnosis not present

## 2023-02-13 ENCOUNTER — Ambulatory Visit (HOSPITAL_COMMUNITY): Admission: RE | Admit: 2023-02-13 | Payer: Medicare PPO | Source: Ambulatory Visit

## 2023-02-17 ENCOUNTER — Other Ambulatory Visit (HOSPITAL_COMMUNITY): Payer: Self-pay | Admitting: Internal Medicine

## 2023-02-17 DIAGNOSIS — Z1231 Encounter for screening mammogram for malignant neoplasm of breast: Secondary | ICD-10-CM

## 2023-02-20 ENCOUNTER — Encounter: Payer: Self-pay | Admitting: Radiology

## 2023-02-25 ENCOUNTER — Telehealth (HOSPITAL_COMMUNITY): Payer: Self-pay | Admitting: *Deleted

## 2023-02-25 NOTE — Telephone Encounter (Signed)
Reaching out to patient to offer assistance regarding upcoming cardiac imaging study; pt verbalizes understanding of appt date/time, parking situation and where to check in, and verified current allergies; name and call back number provided for further questions should they arise  Gordy Clement RN [facility] [phone] office [phone] cell  Patient denies metal but does report claustrophobia. She is aware that she can take her Klonopin for the test but will need a driver.

## 2023-02-26 ENCOUNTER — Other Ambulatory Visit: Payer: Self-pay | Admitting: Cardiology

## 2023-02-26 ENCOUNTER — Ambulatory Visit (HOSPITAL_COMMUNITY)
Admission: RE | Admit: 2023-02-26 | Discharge: 2023-02-26 | Disposition: A | Discharge status: Home / Self Care | Payer: Medicare PPO | Source: Ambulatory Visit | Attending: Cardiology | Admitting: Cardiology

## 2023-02-26 DIAGNOSIS — R079 Chest pain, unspecified: Secondary | ICD-10-CM | POA: Diagnosis not present

## 2023-02-26 DIAGNOSIS — R931 Abnormal findings on diagnostic imaging of heart and coronary circulation: Secondary | ICD-10-CM

## 2023-02-26 MED ORDER — GADOBUTROL 1 MMOL/ML IV SOLN
12.0000 mL | Freq: Once | INTRAVENOUS | Status: AC | PRN
  Administered 2023-02-26: 12 mL via INTRAVENOUS

## 2023-03-03 DIAGNOSIS — E119 Type 2 diabetes mellitus without complications: Secondary | ICD-10-CM | POA: Diagnosis not present

## 2023-03-03 DIAGNOSIS — I10 Essential (primary) hypertension: Secondary | ICD-10-CM | POA: Diagnosis not present

## 2023-03-15 LAB — GLUCOSE, POCT (MANUAL RESULT ENTRY): Glucose Fasting, POC: 113 mg/dL — AB (ref 70–99)

## 2023-03-18 ENCOUNTER — Encounter: Payer: Self-pay | Admitting: *Deleted

## 2023-03-18 NOTE — Progress Notes (Signed)
Pt attended 03/15/23 screening event when results were wnl. Pt identified Dr. Legrand Rams as her PCP (who does not use EPIC charting) and chart review indicates future PCP-ordered mmg as well as ongoing cardiology support also. No additional SDOH barriers to care noted at this event. Pt has [facility] documented future cardiology appt 5/24. No additional health equity team support indicated at this time.

## 2023-03-20 DIAGNOSIS — R32 Unspecified urinary incontinence: Secondary | ICD-10-CM | POA: Diagnosis not present

## 2023-03-20 DIAGNOSIS — I129 Hypertensive chronic kidney disease with stage 1 through stage 4 chronic kidney disease, or unspecified chronic kidney disease: Secondary | ICD-10-CM | POA: Diagnosis not present

## 2023-03-20 DIAGNOSIS — F411 Generalized anxiety disorder: Secondary | ICD-10-CM | POA: Diagnosis not present

## 2023-03-20 DIAGNOSIS — J4489 Other specified chronic obstructive pulmonary disease: Secondary | ICD-10-CM | POA: Diagnosis not present

## 2023-03-20 DIAGNOSIS — M1990 Unspecified osteoarthritis, unspecified site: Secondary | ICD-10-CM | POA: Diagnosis not present

## 2023-03-20 DIAGNOSIS — K219 Gastro-esophageal reflux disease without esophagitis: Secondary | ICD-10-CM | POA: Diagnosis not present

## 2023-03-20 DIAGNOSIS — F325 Major depressive disorder, single episode, in full remission: Secondary | ICD-10-CM | POA: Diagnosis not present

## 2023-03-20 DIAGNOSIS — E669 Obesity, unspecified: Secondary | ICD-10-CM | POA: Diagnosis not present

## 2023-03-20 DIAGNOSIS — N1831 Chronic kidney disease, stage 3a: Secondary | ICD-10-CM | POA: Diagnosis not present

## 2023-03-21 ENCOUNTER — Ambulatory Visit (HOSPITAL_COMMUNITY)
Admission: RE | Admit: 2023-03-21 | Discharge: 2023-03-21 | Disposition: A | Discharge status: Home / Self Care | Payer: Medicare PPO | Source: Ambulatory Visit | Attending: Internal Medicine | Admitting: Internal Medicine

## 2023-03-21 ENCOUNTER — Encounter (HOSPITAL_COMMUNITY): Payer: Self-pay

## 2023-03-21 DIAGNOSIS — Z1231 Encounter for screening mammogram for malignant neoplasm of breast: Secondary | ICD-10-CM | POA: Insufficient documentation

## 2023-04-03 DIAGNOSIS — I10 Essential (primary) hypertension: Secondary | ICD-10-CM | POA: Diagnosis not present

## 2023-04-03 DIAGNOSIS — E119 Type 2 diabetes mellitus without complications: Secondary | ICD-10-CM | POA: Diagnosis not present

## 2023-04-16 ENCOUNTER — Ambulatory Visit: Payer: Medicare PPO | Admitting: Adult Health

## 2023-04-16 ENCOUNTER — Encounter: Payer: Self-pay | Admitting: Adult Health

## 2023-04-16 VITALS — BP 132/75 | HR 73 | Ht 68.0 in | Wt 212.0 lb

## 2023-04-16 DIAGNOSIS — R1031 Right lower quadrant pain: Secondary | ICD-10-CM

## 2023-04-16 NOTE — Progress Notes (Signed)
  Subjective:     Patient ID: Lindsay Wilkerson, female   DOB: 03/31/1946, 77 y.o.   MRN: [id number]  HPI Lyrika is a 77 year old black female, divorced,PM in complaining of pain in RLQ with orgasm.  PCP is Dr Felecia Shelling   Review of Systems +pain RLQ with orgasm(self induced) Has sex once in a blue moon she says  No problems with urination or bowel movements   Reviewed past medical,surgical, social and family history. Reviewed medications and allergies.  Objective:   Physical Exam BP 132/75 (BP Location: Left Arm, Patient Position: Sitting, Cuff Size: Large)   Pulse 73   Ht  (1.727 m)   Wt 212 lb (96.2 kg)   BMI 32.23 kg/m     Skin warm and dry.Pelvic: external genitalia is normal in appearance no lesions, vagina: pale,urethra has no lesions or masses noted, cervix:smooth, uterus: normal size, shape and contour, non tender, no masses felt, adnexa: no masses, RLQ tenderness noted. Bladder is non tender and no masses felt.  Fall risk is low  Upstream - 04/16/23 1217       Pregnancy Intention Screening   Does the patient want to become pregnant in the next year? N/A    Does the patient's partner want to become pregnant in the next year? N/A    Would the patient like to discuss contraceptive options today? N/A      Contraception Wrap Up   Current Method No Method - Other Reason   postmenopausal   Reason for No Current Contraceptive Method at Intake ([facility] Only) Other    End Method No Method - Other Reason   postmenopausal   Contraception Counseling Provided No            Examination chaperoned by Malachy Mood LPN  Assessment:     1. RLQ abdominal pain Has pain RLQ with orgasm  Tender RLQ on exam Scheduled pelvic US 04/30/23 at 9:30 am at [facility] to assess uterus and ovaries, will talk when results back  - US PELVIC COMPLETE WITH TRANSVAGINAL; Future     Plan:     Follow up prn

## 2023-04-24 NOTE — Progress Notes (Signed)
Cardiology Office Note    Date:  04/25/2023  ID:  Lindsay Wilkerson, DOB 1946/10/15, MRN [id number] Cardiologist: Meriam Sprague, MD    History of Present Illness:    Lindsay Wilkerson is a 77 y.o. female with past medical history of HTN, Type 2 DM, COPD and depression who presents to the office today for 51-month follow-up.  She was last examined by Dr. Shari Prows in 10/2022 as a new patient referral for worsening dyspnea on exertion and fatigue over the past 4 to 5 months. She denied any associated exertional chest pain. Given her concerning symptoms, a Coronary CT along with echocardiogram were recommended for further evaluation. Her echocardiogram showed a preserved EF greater than 75% with no regional wall motion abnormalities. She did have moderate LVH, normal RV function and no significant valve abnormalities. Given the thickness of her heart muscle, a cardiac MRI was recommended for further assessment and this showed normal LV and RV function with a small area of subepicardial LGE in the mid inferior septal wall at the RV insertion site but was nonspecific. Was also noted to have mildly elevated extracellular volume percentage of 31% but was not in the range consistent with cardiac amyloidosis. Her study was overall most consistent with LVH due to longstanding hypertension. Her Coronary CT was also obtained in the interim and showed a coronary calcium score of 0.  In talking with the patient today, she reports overall doing well from a cardiac perspective since her last office visit. She reports remaining active around her home and denies any recent chest pain or progressive dyspnea on exertion with this. She does have COPD and uses her inhalers as needed. No specific palpitations, orthopnea, PND or pitting edema.  Studies Reviewed:   EKG: EKG is ordered today and demonstrates NSR, HR 61 with TWI along inferior and lateral leads which is noted on prior tracings.   Echocardiogram:  10/2022 IMPRESSIONS     1. Left ventricular ejection fraction, by estimation, is >75%. The left  ventricle has hyperdynamic function. The left ventricle has no regional  wall motion abnormalities. There is moderate concentric left ventricular  hypertrophy. There is apical cavity  obliteration with systole, but not clear apical variant hypertrophy. Left  ventricular diastolic parameters are indeterminate.   2. Right ventricular systolic function is normal. The right ventricular  size is normal. Tricuspid regurgitation signal is inadequate for assessing  PA pressure.   3. The mitral valve is grossly normal. No evidence of mitral valve  regurgitation.   4. The aortic valve is tricuspid. Aortic valve regurgitation is not  visualized. Aortic valve sclerosis is present, with no evidence of aortic  valve stenosis. Aortic valve mean gradient measures 3.0 mmHg.   5. The inferior vena cava is normal in size with greater than 50%  respiratory variability, suggesting right atrial pressure of 3 mmHg.   Comparison(s): Prior images unable to be directly viewed.   Coronary CT: 11/2022 IMPRESSION: 1. Coronary calcium score of 0. This was 1st percentile for age, sex, and race matched control.   2. Normal coronary origin with right dominance.   3. CAD-RADS 0. No evidence of CAD (0%). Consider non-atherosclerotic causes of chest pain.  IMPRESSION: 1. Moderate enlargement of the main pulmonary artery as can be seen with chronic pulmonary arterial hypertension. 2. Left lung base average 7.5 mm transverse dimension nodule appears unchanged from 10/05/2020 and therefore benign. No follow-up imaging is recommended.  cMRI: 02/2023 MPRESSION: 1. Normal left ventricular size with moderate  concentric LV hypertrophy, EF 65%.   2.  Normal RV size and systolic function, EF 51%.   3. Small area of subepicardial LGE in the mid inferoseptal wall at the RV insertion site. This is nonspecific and can  indicate pressure/volume overload.   4. Mildly elevated extracellular volume percentage at 31%. This suggests mildly elevated myocardial fibrotic content and is nonspecific. Not in cardiac amyloidosis range.   This study is most consistent with LVH due to long-standing HTN.   Physical Exam:   VS:  BP 120/74   Pulse 61   Ht 5\' 8"  (1.727 m)   Wt 209 lb (94.8 kg)   SpO2 97%   BMI 31.78 kg/m    Wt Readings from Last 3 Encounters:  04/25/23 209 lb (94.8 kg)  04/16/23 212 lb (96.2 kg)  10/31/22 213 lb (96.6 kg)     GEN: Pleasant female appearing in no acute distress NECK: No JVD; No carotid bruits CARDIAC: RRR, no murmurs, rubs, gallops RESPIRATORY:  Clear to auscultation without rales, wheezing or rhonchi  ABDOMEN: Appears non-distended. No obvious abdominal masses. EXTREMITIES: No clubbing or cyanosis. No pitting edema.  Distal pedal pulses are 2+ bilaterally.   Assessment and Plan:   1. Dyspnea on Exertion - Prior cardiac workup was reassuring with Coronary CT showing a calcium score of 0 and echocardiogram showed a preserved EF but she was noted to have hypertensive heart disease as described above. No indication for further cardiac testing at this time. Suspect her symptoms are secondary to known COPD.   2. Hypertensive Heart Disease - Recent echocardiogram and cMRI were consistent with hypertensive heart disease as described above. She denies any recent issues with fluid retention. She does remain on Chlorthalidone 25 mg daily.  3. HTN - Her blood pressure is well-controlled at 120/74 during today's visit. Continue current medical therapy with Atenolol-Chlorthalidone 50-25 mg daily.   Signed, Ellsworth Lennox, PA-C

## 2023-04-25 ENCOUNTER — Encounter: Payer: Self-pay | Admitting: Student

## 2023-04-25 ENCOUNTER — Ambulatory Visit: Payer: Medicare PPO | Attending: Student | Admitting: Student

## 2023-04-25 VITALS — BP 120/74 | HR 61 | Ht 68.0 in | Wt 209.0 lb

## 2023-04-25 DIAGNOSIS — I10 Essential (primary) hypertension: Secondary | ICD-10-CM | POA: Diagnosis not present

## 2023-04-25 DIAGNOSIS — R0609 Other forms of dyspnea: Secondary | ICD-10-CM | POA: Diagnosis not present

## 2023-04-25 DIAGNOSIS — I119 Hypertensive heart disease without heart failure: Secondary | ICD-10-CM | POA: Diagnosis not present

## 2023-04-25 NOTE — Patient Instructions (Signed)
Medication Instructions:  Your physician recommends that you continue on your current medications as directed. Please refer to the Current Medication list given to you today.   Labwork: None today  Testing/Procedures: None today  Follow-Up: 1 year  Any Other Special Instructions Will Be Listed Below (If Applicable).  If you need a refill on your cardiac medications before your next appointment, please call your pharmacy.  

## 2023-04-28 DIAGNOSIS — L648 Other androgenic alopecia: Secondary | ICD-10-CM | POA: Diagnosis not present

## 2023-04-28 DIAGNOSIS — D235 Other benign neoplasm of skin of trunk: Secondary | ICD-10-CM | POA: Diagnosis not present

## 2023-04-28 DIAGNOSIS — L821 Other seborrheic keratosis: Secondary | ICD-10-CM | POA: Diagnosis not present

## 2023-04-30 ENCOUNTER — Ambulatory Visit (HOSPITAL_COMMUNITY)
Admission: RE | Admit: 2023-04-30 | Discharge: 2023-04-30 | Disposition: A | Discharge status: Home / Self Care | Payer: Medicare PPO | Source: Ambulatory Visit | Attending: Adult Health | Admitting: Adult Health

## 2023-04-30 DIAGNOSIS — R1031 Right lower quadrant pain: Secondary | ICD-10-CM | POA: Diagnosis not present

## 2023-04-30 DIAGNOSIS — D251 Intramural leiomyoma of uterus: Secondary | ICD-10-CM | POA: Diagnosis not present

## 2023-05-02 ENCOUNTER — Telehealth: Payer: Self-pay | Admitting: Adult Health

## 2023-05-02 NOTE — Telephone Encounter (Signed)
Pt wanted to know if the endometrial thickening would cause discomfort. I spoke with Lindsay Wilkerson and she advised it can cause discomfort but to see Dr. Charlotta Newton for a biopsy and we would go from there. Pt voiced understanding. JSY

## 2023-05-02 NOTE — Telephone Encounter (Signed)
I got patient scheduled but she has a question to ask you. She said it depends on the answer whether she keeps or cancels appointment. Please advise.

## 2023-05-02 NOTE — Telephone Encounter (Signed)
Pt aware of [place]. Will get endometrial biopsy with Dr Charlotta Newton due to endometrial thickening

## 2023-05-03 DIAGNOSIS — I10 Essential (primary) hypertension: Secondary | ICD-10-CM | POA: Diagnosis not present

## 2023-05-03 DIAGNOSIS — E119 Type 2 diabetes mellitus without complications: Secondary | ICD-10-CM | POA: Diagnosis not present

## 2023-05-20 ENCOUNTER — Telehealth: Payer: Self-pay | Admitting: *Deleted

## 2023-05-20 ENCOUNTER — Encounter: Payer: Self-pay | Admitting: Obstetrics & Gynecology

## 2023-05-20 NOTE — Telephone Encounter (Signed)
Spoke to patient. States she was told she was not scheduled for an endometrial biopsy but only to discuss U/S. Informed she was scheduled for biopsy. Also questions regarding right ovary not being visualized on ovary as that is the side she is experiencing pain.  Informed Dr Charlotta Newton could look at u/s images at tomorrows visit as well.  Pt verbalized understanding with no further questions.

## 2023-05-21 ENCOUNTER — Ambulatory Visit: Payer: Medicare PPO | Admitting: Obstetrics & Gynecology

## 2023-05-21 ENCOUNTER — Encounter: Payer: Self-pay | Admitting: Obstetrics & Gynecology

## 2023-05-21 VITALS — BP 125/61 | HR 59 | Ht 68.0 in | Wt 211.0 lb

## 2023-05-21 DIAGNOSIS — N3281 Overactive bladder: Secondary | ICD-10-CM

## 2023-05-21 DIAGNOSIS — R102 Pelvic and perineal pain: Secondary | ICD-10-CM

## 2023-05-21 DIAGNOSIS — R9389 Abnormal findings on diagnostic imaging of other specified body structures: Secondary | ICD-10-CM | POA: Diagnosis not present

## 2023-05-21 DIAGNOSIS — L80 Vitiligo: Secondary | ICD-10-CM

## 2023-05-21 MED ORDER — MIRABEGRON ER 25 MG PO TB24: 25.0000 mg | ORAL_TABLET | Freq: Every day | ORAL | 4 refills | Status: AC

## 2023-05-21 MED ORDER — TRIAMCINOLONE ACETONIDE 0.5 % EX OINT: TOPICAL_OINTMENT | CUTANEOUS | 3 refills | Status: DC

## 2023-05-21 NOTE — Addendum Note (Signed)
Addended by: Sharon Seller on: 05/21/2023 12:24 PM   Modules accepted: Level of Service

## 2023-05-21 NOTE — Progress Notes (Addendum)
GYN VISIT Patient name: Lindsay Wilkerson MRN [id number]  Date of birth: 07/10/1946 Chief Complaint:   Procedure (Endometrial biopsy)  History of Present Illness:   Lindsay Wilkerson is a 77 y.o. G3P0030 PM female being seen today for follow up regarding:  Pelvic pain: In review, pt initially had pelvic US completed due to RLQ pain after orgasm.  Denies pain any other time.  Denies any vaginal bleeding or irritation.  She also notes some left-sided pain that she notes is due to diverticulosis.    [place] 04/30/2023:  7.3 4.5 x 6 cm - 100 mL.  2.7 right intramural fibroid.  7 mm endometrium. Right ovary not seen obscured by bowel.  Normal left ovary  She also notes external itching, does not use any treatment.  Urinary concerns: Urinary urgency.  Notes urge incontinence if holds urine too long.  Denies stress incontinence.  No LMP recorded. Patient is postmenopausal.     Review of Systems:   Pertinent items are noted in HPI Denies fever/chills, dizziness, headaches, visual disturbances, fatigue, shortness of breath, chest pain, abdominal pain, vomiting, no problems with bowel movements. Pertinent History Reviewed:   Past Surgical History:  Procedure Laterality Date   COLONOSCOPY WITH PROPOFOL N/A 07/18/2022   Procedure: COLONOSCOPY WITH PROPOFOL;  Surgeon: Dolores Frame, MD;  Location: [facility];  Service: Gastroenterology;  Laterality: N/A;  1035 ASA 1   EYE SURGERY     IR FALLOPIAN TUBE CATHETERIZATION Left    1980   POLYPECTOMY  07/18/2022   Procedure: POLYPECTOMY;  Surgeon: Dolores Frame, MD;  Location: [facility];  Service: Gastroenterology;;   TONSILLECTOMY     Age 63    Past Medical History:  Diagnosis Date   Depression, major    Diabetes mellitus without complication (HCC)    Hypertension    Urinary bladder incontinence    Reviewed problem list, medications and allergies. Physical Assessment:   Vitals:   05/21/23 0905  BP: 125/61  Pulse:  (!) 59  Weight: 211 lb (95.7 kg)  Height: 5\' 8"  (1.727 m)  Body mass index is 32.08 kg/m.       Physical Examination:   General appearance: alert, well appearing, and in no distress  Psych: mood appropriate, normal affect  Skin: warm & dry   Cardiovascular: normal heart rate noted  Respiratory: normal respiratory effort, no distress  Abdomen: soft, non-tender, no reproducible pain  Pelvic: patches of hypopigmentation with pink appearing skin noted bilateral labia majora extending to perineum and rectal area.  No discrete mass or lesion seen.  Normal vaginal mucosa- no discharge or lesions.  Normal cervix and uterus.  No masses or reproducible pain on bimanual exam  Extremities: no edema, no calf tenderness bilaterally  Chaperone: Faith Rogue    Assessment & Plan:  1) Thickened endometrium, Pelvic pain -Patient asymptomatic.  Discussed that pelvic pain not related to thickened endometrium -Based on clinical history suspect pain is GI in nature -Discussed as incidental finding and reviewed that risk of endometrial cancer more concerning if lining greater than 12-13 mm -Discussed while endometrial biopsy is not necessarily indicated could consider proceeding due to lining of above 4 mm.  After much discussion patient declines endometrial biopsy at this time.  Should she note any episodes of spotting or bleeding patient to return to clinic for EMB  2) OAB -Reviewed conservative options including cutting back on caffeine -Discussed medical management, reviewed conservative for insurance coverage -Rx sent in, follow-up in 2 to  3 months  3) Vitiligo -Reviewed findings on exam and reassured patient of benign findings -Steroid cream sent in -Follow-up as discussed  Meds ordered this encounter  Medications   mirabegron ER (MYRBETRIQ) 25 MG TB24 tablet    Sig: Take 1 tablet (25 mg total) by mouth daily.    Dispense:  90 tablet    Refill:  4   triamcinolone ointment (KENALOG) 0.5 %     Sig: Pea-sized amount to affected area twice weekly as needed    Dispense:  15 g    Refill:  3     Return in about 3 months (around 08/21/2023) for Medication follow up.   Myna Hidalgo, DO Attending Obstetrician & Gynecologist, [facility] for [employer], [facility]

## 2023-06-05 DIAGNOSIS — Z1389 Encounter for screening for other disorder: Secondary | ICD-10-CM | POA: Diagnosis not present

## 2023-06-05 DIAGNOSIS — J45909 Unspecified asthma, uncomplicated: Secondary | ICD-10-CM | POA: Diagnosis not present

## 2023-06-05 DIAGNOSIS — F419 Anxiety disorder, unspecified: Secondary | ICD-10-CM | POA: Diagnosis not present

## 2023-06-05 DIAGNOSIS — Z1331 Encounter for screening for depression: Secondary | ICD-10-CM | POA: Diagnosis not present

## 2023-06-05 DIAGNOSIS — E119 Type 2 diabetes mellitus without complications: Secondary | ICD-10-CM | POA: Diagnosis not present

## 2023-06-05 DIAGNOSIS — Z0001 Encounter for general adult medical examination with abnormal findings: Secondary | ICD-10-CM | POA: Diagnosis not present

## 2023-06-05 DIAGNOSIS — K219 Gastro-esophageal reflux disease without esophagitis: Secondary | ICD-10-CM | POA: Diagnosis not present

## 2023-06-05 DIAGNOSIS — I10 Essential (primary) hypertension: Secondary | ICD-10-CM | POA: Diagnosis not present

## 2023-06-05 DIAGNOSIS — I700 Atherosclerosis of aorta: Secondary | ICD-10-CM | POA: Diagnosis not present

## 2023-06-05 DIAGNOSIS — E1122 Type 2 diabetes mellitus with diabetic chronic kidney disease: Secondary | ICD-10-CM | POA: Diagnosis not present

## 2023-06-24 ENCOUNTER — Ambulatory Visit (HOSPITAL_COMMUNITY)
Admission: RE | Admit: 2023-06-24 | Discharge: 2023-06-24 | Disposition: A | Discharge status: Home / Self Care | Payer: Medicare PPO | Source: Ambulatory Visit | Attending: Internal Medicine | Admitting: Internal Medicine

## 2023-06-24 ENCOUNTER — Other Ambulatory Visit (HOSPITAL_COMMUNITY): Payer: Self-pay | Admitting: Internal Medicine

## 2023-06-24 DIAGNOSIS — M542 Cervicalgia: Secondary | ICD-10-CM | POA: Insufficient documentation

## 2023-06-24 DIAGNOSIS — M5092 Unspecified cervical disc disorder, mid-cervical region, unspecified level: Secondary | ICD-10-CM | POA: Diagnosis not present

## 2023-06-24 DIAGNOSIS — A09 Infectious gastroenteritis and colitis, unspecified: Secondary | ICD-10-CM | POA: Diagnosis not present

## 2023-06-24 DIAGNOSIS — R519 Headache, unspecified: Secondary | ICD-10-CM | POA: Diagnosis not present

## 2023-06-24 DIAGNOSIS — M50322 Other cervical disc degeneration at C5-C6 level: Secondary | ICD-10-CM | POA: Diagnosis not present

## 2023-06-24 DIAGNOSIS — E1165 Type 2 diabetes mellitus with hyperglycemia: Secondary | ICD-10-CM | POA: Diagnosis not present

## 2023-07-09 ENCOUNTER — Encounter: Payer: Self-pay | Admitting: Orthopaedic Surgery

## 2023-07-09 ENCOUNTER — Ambulatory Visit: Payer: Medicare PPO | Admitting: Orthopaedic Surgery

## 2023-07-09 VITALS — BP 121/78 | HR 77 | Ht 68.0 in | Wt 211.0 lb

## 2023-07-09 DIAGNOSIS — M542 Cervicalgia: Secondary | ICD-10-CM | POA: Diagnosis not present

## 2023-07-09 MED ORDER — DIAZEPAM 2 MG PO TABS: ORAL_TABLET | ORAL | 0 refills | Status: AC

## 2023-07-09 NOTE — Progress Notes (Signed)
My neck hurts.  She has had about a month to six weeks of pain in the neck and lower back skull and headaches from it. She denies any trauma or untoward event.  She has no numbness, no weakness, no visual problems, no memory problems.  It hurts most of the time.  She has not been awakened by it.  She has some pain turning her head to either side.  She has seen Dr. Felecia Shelling for this.  She had X-rays which showed of the cervical spine: IMPRESSION: Mild-to-moderate multilevel cervical spine DDD, worse at C5-C6 and C6-C7, similar to the 04/2021 examination.  I have independently reviewed and interpreted x-rays of this patient done at another site by another physician or qualified health professional.  She is concerned she is not getting better.  She has taken Tylenol with no help. She has used ice and heat with only slight temporary help.    ROM of the neck is about 10 degrees to either side, no spasm but the pain is more of the upper neck and lower skull area.  Trapezius is normal.  ROM of shoulders is full.  Grips are normal.  NV intact.    Encounter Diagnosis  Name Primary?   Cervicalgia Yes   I will get a MRI of the cervical spine.  I will give diazepam to take prior to bedtime nightly.  I will have her take ibuprofen 600 mgm twice a day or three times a day.  Return in three weeks.  Call if any problem.  Precautions discussed.  Electronically Signed Darreld Mclean, MD 7/17/202410:18 AM

## 2023-07-09 NOTE — Patient Instructions (Addendum)
While we are working on your approval for MRI please go ahead and call to schedule your appointment with [facility] within at least one (1) week.   [phone] is the phone number to call and schedule   Take Ibuprofen 3 times daily or Aleve twice daily with food for the neck pain/ to help with the inflammation in your neck

## 2023-07-14 ENCOUNTER — Telehealth: Payer: Self-pay | Admitting: Orthopaedic Surgery

## 2023-07-14 NOTE — Telephone Encounter (Signed)
Dr. Sanjuan Dame pt - spoke w/the patient, she stated that Dr. Hilda Lias is sending her to have a MRI of her cervical spine, she wants to know why he didn't include her head.

## 2023-07-14 NOTE — Telephone Encounter (Signed)
I called and spoke with patient.  She is having sharp pain in her head, for 2 months now.  I told her Brain/Head is out of our area of expertise.  She has seen a neurologist at [facility] previously, and I encouraged her to call that office and/or primary care for guidance.  Also to call [facility] back to arrange C-spine MRI if she wishes.  (She had told [facility] to hold off, wanted to get MRI of head too.)

## 2023-07-25 DIAGNOSIS — E119 Type 2 diabetes mellitus without complications: Secondary | ICD-10-CM | POA: Diagnosis not present

## 2023-07-25 DIAGNOSIS — I10 Essential (primary) hypertension: Secondary | ICD-10-CM | POA: Diagnosis not present

## 2023-07-25 DIAGNOSIS — M47812 Spondylosis without myelopathy or radiculopathy, cervical region: Secondary | ICD-10-CM | POA: Diagnosis not present

## 2023-07-26 LAB — GLUCOSE, POCT (MANUAL RESULT ENTRY): Glucose Fasting, POC: 140 mg/dL — AB (ref 70–99)

## 2023-07-26 NOTE — Progress Notes (Signed)
Pt has no SDOH

## 2023-08-05 ENCOUNTER — Encounter: Payer: Self-pay | Admitting: Orthopaedic Surgery

## 2023-08-05 ENCOUNTER — Ambulatory Visit: Payer: Medicare PPO | Admitting: Orthopaedic Surgery

## 2023-08-05 VITALS — BP 116/72 | HR 56

## 2023-08-05 DIAGNOSIS — M542 Cervicalgia: Secondary | ICD-10-CM | POA: Diagnosis not present

## 2023-08-05 NOTE — Progress Notes (Signed)
My neck is a little better.  She had MRI of the cervical spine showing: IMPRESSION: Mild-to-moderate multilevel cervical spine DDD, worse at C5-C6 and C6-C7, similar to the 04/2021 examination.  I have explained the findings to her.    I have independently reviewed the MRI.     ROM of the neck is good today and NV intact.  Grips normal.  Encounter Diagnosis  Name Primary?   Cervicalgia Yes   Continue present medicine.  Return in six weeks.  She has headaches at time and will discuss with Dr. Felecia Shelling.  Call if any problem.  Precautions discussed.  Electronically Signed Darreld Mclean, MD 8/13/20242:38 PM

## 2023-08-07 ENCOUNTER — Encounter: Payer: Self-pay | Admitting: *Deleted

## 2023-08-07 NOTE — Progress Notes (Signed)
Pt attended 07/26/23 screening event where her b/p was 107//65 and her blood sugar was 140. At the event, the pt noted her PCP was Dr. Felecia Shelling and she did not identify any SDOH insecurities. Although Dr. Felecia Shelling does not document in [facility], chart review media referral documentation confirms pt had office visits with PCP on 04/03/23 , with both Melody Comas, NP and Dr. Felecia Shelling on 06/05/23 for her annual wellness visit and annual exam, and by Dr. Felecia Shelling on 06/24/23. No additional health equity team support indicated at this time.

## 2023-08-12 ENCOUNTER — Encounter: Payer: Self-pay | Admitting: Neurology

## 2023-08-25 DIAGNOSIS — E119 Type 2 diabetes mellitus without complications: Secondary | ICD-10-CM | POA: Diagnosis not present

## 2023-08-25 DIAGNOSIS — I10 Essential (primary) hypertension: Secondary | ICD-10-CM | POA: Diagnosis not present

## 2023-09-16 ENCOUNTER — Ambulatory Visit: Payer: Medicare PPO | Admitting: Orthopaedic Surgery

## 2023-09-24 DIAGNOSIS — E119 Type 2 diabetes mellitus without complications: Secondary | ICD-10-CM | POA: Diagnosis not present

## 2023-09-24 DIAGNOSIS — I10 Essential (primary) hypertension: Secondary | ICD-10-CM | POA: Diagnosis not present

## 2023-09-29 ENCOUNTER — Ambulatory Visit: Payer: Medicare PPO | Admitting: Neurology

## 2023-10-16 DIAGNOSIS — G44209 Tension-type headache, unspecified, not intractable: Secondary | ICD-10-CM | POA: Diagnosis not present

## 2023-10-16 DIAGNOSIS — G43009 Migraine without aura, not intractable, without status migrainosus: Secondary | ICD-10-CM | POA: Diagnosis not present

## 2023-10-16 DIAGNOSIS — M545 Low back pain, unspecified: Secondary | ICD-10-CM | POA: Diagnosis not present

## 2023-10-16 DIAGNOSIS — G4700 Insomnia, unspecified: Secondary | ICD-10-CM | POA: Diagnosis not present

## 2023-10-25 DIAGNOSIS — E119 Type 2 diabetes mellitus without complications: Secondary | ICD-10-CM | POA: Diagnosis not present

## 2023-10-25 DIAGNOSIS — I10 Essential (primary) hypertension: Secondary | ICD-10-CM | POA: Diagnosis not present

## 2023-11-14 DIAGNOSIS — Z0001 Encounter for general adult medical examination with abnormal findings: Secondary | ICD-10-CM | POA: Diagnosis not present

## 2023-11-14 DIAGNOSIS — E119 Type 2 diabetes mellitus without complications: Secondary | ICD-10-CM | POA: Diagnosis not present

## 2023-11-14 DIAGNOSIS — I10 Essential (primary) hypertension: Secondary | ICD-10-CM | POA: Diagnosis not present

## 2023-11-14 DIAGNOSIS — J45909 Unspecified asthma, uncomplicated: Secondary | ICD-10-CM | POA: Diagnosis not present

## 2023-11-14 DIAGNOSIS — K219 Gastro-esophageal reflux disease without esophagitis: Secondary | ICD-10-CM | POA: Diagnosis not present

## 2023-11-14 DIAGNOSIS — E785 Hyperlipidemia, unspecified: Secondary | ICD-10-CM | POA: Diagnosis not present

## 2023-11-17 DIAGNOSIS — H04123 Dry eye syndrome of bilateral lacrimal glands: Secondary | ICD-10-CM | POA: Diagnosis not present

## 2023-11-17 DIAGNOSIS — E119 Type 2 diabetes mellitus without complications: Secondary | ICD-10-CM | POA: Diagnosis not present

## 2023-11-17 DIAGNOSIS — Z111 Encounter for screening for respiratory tuberculosis: Secondary | ICD-10-CM | POA: Diagnosis not present

## 2023-12-18 DIAGNOSIS — E119 Type 2 diabetes mellitus without complications: Secondary | ICD-10-CM | POA: Diagnosis not present

## 2023-12-18 DIAGNOSIS — J4531 Mild persistent asthma with (acute) exacerbation: Secondary | ICD-10-CM | POA: Diagnosis not present

## 2024-01-18 DIAGNOSIS — I10 Essential (primary) hypertension: Secondary | ICD-10-CM | POA: Diagnosis not present

## 2024-01-18 DIAGNOSIS — E119 Type 2 diabetes mellitus without complications: Secondary | ICD-10-CM | POA: Diagnosis not present

## 2024-02-18 DIAGNOSIS — E119 Type 2 diabetes mellitus without complications: Secondary | ICD-10-CM | POA: Diagnosis not present

## 2024-02-18 DIAGNOSIS — I10 Essential (primary) hypertension: Secondary | ICD-10-CM | POA: Diagnosis not present

## 2024-03-04 ENCOUNTER — Other Ambulatory Visit (HOSPITAL_COMMUNITY): Payer: Self-pay | Admitting: Internal Medicine

## 2024-03-04 DIAGNOSIS — Z1231 Encounter for screening mammogram for malignant neoplasm of breast: Secondary | ICD-10-CM

## 2024-03-09 DIAGNOSIS — J310 Chronic rhinitis: Secondary | ICD-10-CM | POA: Diagnosis not present

## 2024-03-09 DIAGNOSIS — H6063 Unspecified chronic otitis externa, bilateral: Secondary | ICD-10-CM | POA: Diagnosis not present

## 2024-03-09 DIAGNOSIS — K219 Gastro-esophageal reflux disease without esophagitis: Secondary | ICD-10-CM | POA: Diagnosis not present

## 2024-03-13 NOTE — Progress Notes (Signed)
 Pt screened for HTN. BP found to be WNL. Encouraged pt to keep follow up appointment with PCP

## 2024-03-17 ENCOUNTER — Encounter: Payer: Self-pay | Admitting: Orthopaedic Surgery

## 2024-03-17 ENCOUNTER — Ambulatory Visit: Admitting: Orthopaedic Surgery

## 2024-03-17 VITALS — BP 142/99 | HR 61 | Ht 68.0 in | Wt 215.0 lb

## 2024-03-17 DIAGNOSIS — M25561 Pain in right knee: Secondary | ICD-10-CM | POA: Diagnosis not present

## 2024-03-17 DIAGNOSIS — E119 Type 2 diabetes mellitus without complications: Secondary | ICD-10-CM | POA: Diagnosis not present

## 2024-03-17 DIAGNOSIS — I10 Essential (primary) hypertension: Secondary | ICD-10-CM | POA: Diagnosis not present

## 2024-03-17 DIAGNOSIS — G8929 Other chronic pain: Secondary | ICD-10-CM

## 2024-03-17 NOTE — Progress Notes (Signed)
 My knee hurts.  She has right knee pain.  She would like to do something other than an injection and medication.  She asked about a copper brace for knee.  I told her I would not recommend that as she has no giving way. She does have a little swelling at times.  She asked about the gel injections.  I discussed that with her.  She wants to think about that.  She asked about exercises.  I will set up PT for her.  Right knee has crepitus and slight effusion, ROM 0 to 105, stable, NV intact, no distal edema.  Encounter Diagnosis  Name Primary?   Chronic pain of right knee Yes   Her left knee has pain at times but not that much today.  Begin PT.  Return in one month.  She has planned two separate trips, one in May and one in June.  Call if any problem.  Precautions discussed.  Electronically Signed Darreld Mclean, MD 3/26/20252:50 PM

## 2024-03-24 ENCOUNTER — Encounter (HOSPITAL_COMMUNITY): Payer: Self-pay

## 2024-03-24 ENCOUNTER — Ambulatory Visit (HOSPITAL_COMMUNITY)
Admission: RE | Admit: 2024-03-24 | Discharge: 2024-03-24 | Disposition: A | Discharge status: Home / Self Care | Source: Ambulatory Visit | Attending: Internal Medicine | Admitting: Internal Medicine

## 2024-03-24 DIAGNOSIS — Z1231 Encounter for screening mammogram for malignant neoplasm of breast: Secondary | ICD-10-CM | POA: Diagnosis not present

## 2024-04-09 ENCOUNTER — Other Ambulatory Visit: Payer: Self-pay

## 2024-04-09 ENCOUNTER — Ambulatory Visit (HOSPITAL_COMMUNITY): Attending: Orthopaedic Surgery

## 2024-04-09 ENCOUNTER — Encounter (HOSPITAL_COMMUNITY): Payer: Self-pay

## 2024-04-09 DIAGNOSIS — G8929 Other chronic pain: Secondary | ICD-10-CM | POA: Insufficient documentation

## 2024-04-09 DIAGNOSIS — R269 Unspecified abnormalities of gait and mobility: Secondary | ICD-10-CM | POA: Diagnosis not present

## 2024-04-09 DIAGNOSIS — M25561 Pain in right knee: Secondary | ICD-10-CM | POA: Insufficient documentation

## 2024-04-09 NOTE — Therapy (Signed)
 OUTPATIENT PHYSICAL THERAPY LOWER EXTREMITY EVALUATION   Patient Name: Lindsay Wilkerson MRN: [id number] DOB:1946/09/29, 78 y.o., female Today's Date: 04/09/2024  END OF SESSION:  PT End of Session - 04/09/24 1559     Visit Number 1    Date for PT Re-Evaluation 05/07/24    Authorization Type [employer] MEDICARE CHOICE PPO    Authorization Time Period seeking auth    Progress Note Due on Visit 4    PT Start Time 1430    PT Stop Time 1515    PT Time Calculation (min) 45 min    Activity Tolerance Patient tolerated treatment well    Behavior During Therapy WFL for tasks assessed/performed             Past Medical History:  Diagnosis Date   Depression, major    Diabetes mellitus without complication (HCC)    Hypertension    Urinary bladder incontinence    Past Surgical History:  Procedure Laterality Date   COLONOSCOPY WITH PROPOFOL  N/A 07/18/2022   Procedure: COLONOSCOPY WITH PROPOFOL ;  Surgeon: Urban Garden, MD;  Location: [facility];  Service: Gastroenterology;  Laterality: N/A;  1035 ASA 1   EYE SURGERY     IR FALLOPIAN TUBE CATHETERIZATION Left    1980   POLYPECTOMY  07/18/2022   Procedure: POLYPECTOMY;  Surgeon: Urban Garden, MD;  Location: [facility];  Service: Gastroenterology;;   TONSILLECTOMY     Age 42   Patient Active Problem List   Diagnosis Date Noted   RLQ abdominal pain 04/16/2023   Choking 05/23/2022   Abdominal pain, left lower quadrant 11/13/2020    PCP: Wyvonna Heidelberg, MD   REFERRING PROVIDER: Pleasant Brilliant, MD  REFERRING DIAG: M79.604,M79.605 (ICD-10-CM) - Bilateral leg pain  THERAPY DIAG:  Acute pain of right knee  Abnormality of gait and mobility  Rationale for Evaluation and Treatment: Rehabilitation  ONSET DATE: Couple of months ago  SUBJECTIVE:   SUBJECTIVE STATEMENT: Right knee swelling, having to drag the R leg due to not being able to bend the knee. Pt reports since the first of this month  she has been doing better for the most part. Bone on bone on the right knee and left knee  PERTINENT HISTORY: -diabetes -COPD -Hypertension PAIN:  Are you having pain? Yes: NPRS scale: 8/10 pain at worst (couple months ago) Pain location: anterior right knee Pain description: sharp Aggravating factors: walking, walking up in the morning (last few days has not been hurting in the morning) Relieving factors: rest, pain meds, ice  PRECAUTIONS: None  RED FLAGS: Bowel or bladder incontinence: Yes: urinary incontinence (4-5 years)    WEIGHT BEARING RESTRICTIONS: No  FALLS:  Has patient fallen in last 6 months? No  OCCUPATION: substitute teaching  PLOF: Independent and Needs assistance with homemaking  PATIENT GOALS: get rid of the pain, like to be able to walk for trip coming up, endurance  NEXT MD VISIT: Cortizone shot next week  OBJECTIVE:  Note: Objective measures were completed at Evaluation unless otherwise noted.  DIAGNOSTIC FINDINGS: 2022 Clinical:  Right knee pain, giving way, no trauma   X-rays were done of the right knee, three views.   There is degenerative changes of the right knee, tricompartmental, moderate, more medially with small osteophyte formation.  No fracture or loose body noted.  Bone quality is good.   Impression:  Moderate tricompartmental degenerative changes of the right knee, more medially.  PATIENT SURVEYS:  LEFS 45/80  COGNITION: Overall cognitive  status: Within functional limits for tasks assessed     SENSATION: WFL Light touch: Impaired  big toes tingling  EDEMA:  R knee visibly larger   PALPATION: No tenderness to palpation  LOWER EXTREMITY ROM:  Active ROM Right eval Left eval  Hip flexion    Hip extension    Hip abduction    Hip adduction    Hip internal rotation    Hip external rotation    Knee flexion 120, pain 122  Knee extension 7, difficult 8, difficult  Ankle dorsiflexion    Ankle plantarflexion    Ankle  inversion    Ankle eversion     (Blank rows = not tested)  LOWER EXTREMITY MMT:  MMT Right eval Left eval  Hip flexion 3+ 4  Hip extension    Hip abduction 4 4  Hip adduction 4 4  Hip internal rotation    Hip external rotation    Knee flexion 4- 4  Knee extension 4- 4  Ankle dorsiflexion 4 4  Ankle plantarflexion    Ankle inversion    Ankle eversion     (Blank rows = not tested)   FUNCTIONAL TESTS:  5 times sit to stand: 15.47, pt states she is feeling much improved since last month 2 minute walk test: 410, knee pain 3-4/10,  SLS L: 14.69 seconds R: 8.40 seconds GAIT: Distance walked: 410 Assistive device utilized: None Level of assistance: Complete Independence Comments: decreased speed, slight limp (decreased RLE stance time after the first minute of )                                                                                                                                TREATMENT DATE:  04/09/2024  Evaluation: -ROM measured, Strength assessed, HEP prescribed, pt educated on prognosis, findings, and importance of HEP compliance if given.  Manual Therapy: -CPA of Lumbar Spinal segments L3-L5, grade II-III mobilizations -STM of Lumbar Paraspinal musculature  Therapeutic Exercise: -Supine bridges 2 sets of 10 reps, 3 second holds, symptomatic, pt cued for max hip extension -Standing 3 way hip 1 sets 10 reps, bilaterally, pt cued for upright trunk and maintaining of neutral spine -Lateral stepping 3 laps 20 feet per lap, second 2 with RTB around ankles, pt cued for upright posture -Forward lunges, 1 set of 5 reps better performance going into RLE, pt cued for core activation and upright posture       PATIENT EDUCATION:  Education details: Pt was educated on findings of PT evaluation, prognosis, frequency of therapy visits and rationale, attendance policy, and HEP if given.   Person educated: Patient Education method: Explanation, Verbal cues, and  Handouts Education comprehension: verbalized understanding and needs further education  HOME EXERCISE PROGRAM: Walking/Riding stationary bike 15 minutes a day  Access Code: [email or web address] URL: [email or web address] Date: 04/09/2024 Prepared by: Armond Bertin  Exercises - Standing Single Leg Stance with Counter Support  - 1 x daily -  7 x weekly - 3 sets - 10 reps - Sit to Stand with Arms Crossed  - 1 x daily - 7 x weekly - 3 sets - 10 reps  ASSESSMENT:  CLINICAL IMPRESSION: Patient is a 78 y.o. female who was seen today for physical therapy evaluation and treatment for M79.604,M79.605 (ICD-10-CM) - Bilateral leg pain.   Patient demonstrates decreased RLE strength, abnormal pain rating, and impaired balance. Patient also demonstrates difficulty with ambulation during today's session with decreased stride length and velocity noted. Patient also demonstrates decreased stance time on RLE towards the end of the 2 MWT. Patient requires education on physical therapy, prognosis, and POC. Patient would benefit from skilled physical therapy for increased endurance with ambulation, gait training with AD during ambulation, increased RLE strength, and balance for improved gait quality, return to higher level of function with ADLs, and progress towards therapy goals.   OBJECTIVE IMPAIRMENTS: Abnormal gait, decreased activity tolerance, decreased balance, decreased endurance, decreased mobility, difficulty walking, decreased strength, and pain.   ACTIVITY LIMITATIONS: carrying, lifting, bending, standing, squatting, stairs, transfers, and locomotion level  PARTICIPATION LIMITATIONS: meal prep, cleaning, laundry, driving, shopping, community activity, and yard work  PERSONAL FACTORS: Age, Time since onset of injury/illness/exacerbation, and 1 comorbidity: COPD  are also affecting patient's functional outcome.   REHAB POTENTIAL: Good  CLINICAL DECISION MAKING: Stable/uncomplicated  EVALUATION  COMPLEXITY: Low  HEP: Access Code: [email or web address] URL: [email or web address] Date: 04/09/2024 Prepared by: Armond Bertin  Exercises - Standing Single Leg Stance with Counter Support  - 1 x daily - 7 x weekly - 3 sets - 10 reps - Sit to Stand with Arms Crossed  - 1 x daily - 7 x weekly - 3 sets - 10 reps   GOALS: Goals reviewed with patient? No  SHORT TERM GOALS: Target date: 04/23/24  Patient will demonstrate evidence of independence with individualized HEP and will report compliance for at least 3 days per week for optimized progression towards remaining therapy goals. Baseline:  Goal status: INITIAL  2.  Patient will report a decrease in pain level during community ambulation by at least 2 points for improved quality of life. Baseline: 4-5/10 Goal status: INITIAL     LONG TERM GOALS: Target date: 05/07/24  Pt will demonstrate a an increase of at least 9 points on the LEFS for improved performance of community ambulation and ADL. Baseline: see objective Goal status: INITIAL  2.  Pt will improve 2 MWT by 140 in order to demonstrate improved functional ambulatory capacity in community setting.  Baseline: see objective Goal status: INITIAL  3.  Pt will demonstrate at least 4/5 MMT for right lower extremity for increased strength during ADL and community ambulation. Baseline: see objective Goal status: INITIAL  5.  Pt will improve SLS on RLE to be equal to LLE in order to improve dynamic/functional balance during functional activities involving SLS. Baseline: see objective Goal status: INITIAL    PLAN:  PT FREQUENCY: 1x/week  PT DURATION: 4 weeks  PLANNED INTERVENTIONS: 97110-Therapeutic exercises, 97530- Therapeutic activity, 97112- Neuromuscular re-education, 97535- Self Care, 51884- Manual therapy, [phone]- Gait training, Patient/Family education, Balance training, Stair training, Joint mobilization, DME instructions, Cryotherapy, and Moist heat  PLAN FOR  NEXT SESSION: gait training with cane, progress LE strengthening and balance activities   Armond Bertin, PT, DPT [facility] Office: [phone] 4:13 PM, 04/09/24  [employer] Auth Request  Referring diagnosis code (ICD 10)? M79.604,M79.605 (ICD-10-CM) - Bilateral leg pain Treatment diagnosis codes (ICD 10)? (if  different than referring diagnosis) M25.561 ; R26.9  What was this (referring dx) caused by? []  Surgery []  Fall [x]  Ongoing issue []  Arthritis []  Other: ____________  Laterality: [x]  Rt []  Lt []  Both  Deficits: [x]  Pain []  Stiffness [x]  Weakness []  Edema [x]  Balance Deficits []  Coordination [x]  Gait Disturbance [x]  ROM []  Other   Functional Tool Score: LEFS 45/80  CPT codes: See Planned Interventions listed in the Plan section of the Evaluation.

## 2024-04-13 NOTE — Progress Notes (Signed)
 Patient attended screening event on 03/13/2024 where Pt B/P was 139/63. At the event pt noted Medicare/Private insurance for coverage, pt document used to for tobacco. Pt did not document SDOH needs. Pt document PCP as Wyvonna Heidelberg, MD at the event.  Chart review confirms pt PCP is Tesfaye Demissie Fanta, MD at [facility]. Pt last PCP office was 02/18/2024 for diabetes complications. Chart review indicates pt was seen by Orthopedic on 03/17/2024 for chronic knee pain. At the office visit on 03/17/2024, pt B/P was 142/99. Chart review indicates pt does have Insurance. Chart review indicates no SDOH needs. Chart review indicates pt has a future appt with Josepha Nickels, MD an Orthopedics on 04/14/2024. No additional Health equity team support indicated at this time.

## 2024-04-14 ENCOUNTER — Encounter: Payer: Self-pay | Admitting: Orthopaedic Surgery

## 2024-04-14 ENCOUNTER — Ambulatory Visit: Admitting: Orthopaedic Surgery

## 2024-04-14 VITALS — BP 136/80 | HR 53 | Ht 68.0 in | Wt 215.0 lb

## 2024-04-14 DIAGNOSIS — M25561 Pain in right knee: Secondary | ICD-10-CM

## 2024-04-14 DIAGNOSIS — G8929 Other chronic pain: Secondary | ICD-10-CM

## 2024-04-14 MED ORDER — METHYLPREDNISOLONE ACETATE 40 MG/ML IJ SUSP
40.0000 mg | Freq: Once | INTRAMUSCULAR | Status: AC
  Administered 2024-04-14: 40 mg via INTRA_ARTICULAR

## 2024-04-14 NOTE — Progress Notes (Signed)
 PROCEDURE NOTE:  The patient requests injections of the right knee , verbal consent was obtained.  The right knee was prepped appropriately after time out was performed.   Sterile technique was observed and injection of 1 cc of DepoMedrol 40mg  with several cc's of plain xylocaine . Anesthesia was provided by ethyl chloride and a 20-gauge needle was used to inject the knee area. The injection was tolerated well.  A band aid dressing was applied.  The patient was advised to apply ice later today and tomorrow to the injection sight as needed.  Encounter Diagnosis  Name Primary?   Chronic pain of right knee Yes   I will get MRI of the right knee.  She has been to PT with minimal help.  She has more swelling and popping.  Return in two weeks.  Call if any problem.  Precautions discussed.  Electronically Signed Pleasant Brilliant, MD 4/23/20252:26 PM

## 2024-04-14 NOTE — Patient Instructions (Signed)
 Get MRI knee right

## 2024-04-14 NOTE — Addendum Note (Signed)
 Addended by: [place] Snow T on: 04/14/2024 03:18 PM   Modules accepted: Orders

## 2024-04-16 ENCOUNTER — Ambulatory Visit (HOSPITAL_COMMUNITY)

## 2024-04-16 DIAGNOSIS — M25561 Pain in right knee: Secondary | ICD-10-CM

## 2024-04-16 DIAGNOSIS — G8929 Other chronic pain: Secondary | ICD-10-CM | POA: Diagnosis not present

## 2024-04-16 DIAGNOSIS — R269 Unspecified abnormalities of gait and mobility: Secondary | ICD-10-CM | POA: Diagnosis not present

## 2024-04-16 NOTE — Therapy (Signed)
 OUTPATIENT PHYSICAL THERAPY LOWER EXTREMITY EVALUATION   Patient Name: Lindsay Wilkerson MRN: [id number] DOB:1945/12/26, 78 y.o., female Today's Date: 04/16/2024  END OF SESSION:  PT End of Session - 04/16/24 1442     Visit Number 2    Number of Visits 4    Date for PT Re-Evaluation 05/07/24    Authorization Type [employer]IC[employer] CHOICE PPO    Authorization Time Period approved 8 visits from 4/18 to 5/21    Authorization - Visit Number 2    Progress Note Due on Visit 4    PT Start Time 1435    PT Stop Time 1515    PT Time Calculation (min) 40 min    Activity Tolerance Patient tolerated treatment well    Behavior During Therapy [facility] for tasks assessed/performed             Past Medical History:  Diagnosis Date   Depression, major    Diabetes mellitus without complication (HCC)    Hypertension    Urinary bladder incontinence    Past Surgical History:  Procedure Laterality Date   COLONOSCOPY WITH PROPOFOL  N/A 07/18/2022   Procedure: COLONOSCOPY WITH PROPOFOL ;  Surgeon: Urban Garden, MD;  [facility];  Service: Gastroenterology;  Laterality: N/A;  1035 ASA 1   EYE SURGERY     IR FALLOPIAN TUBE CATHETERIZATION Left    1980   POLYPECTOMY  07/18/2022   Procedure: POLYPECTOMY;  Surgeon: Umberto Ganong, Bearl Limes, MD;  Location: [facility];  Service: Gastroenterology;;   TONSILLECTOMY     Age 27   Patient Active Problem List   Diagnosis Date Noted   RLQ abdominal pain 04/16/2023   Choking 05/23/2022   Abdominal pain, left lower quadrant 11/13/2020    PCP: Wyvonna Heidelberg, MD   REFERRING PROVIDER: Pleasant Brilliant, MD  REFERRING DIAG: M79.604,M79.605 (ICD-10-CM) - Bilateral leg pain  THERAPY DIAG:  No diagnosis found.  Rationale for Evaluation and Treatment: Rehabilitation  ONSET DATE: Couple of months ago  SUBJECTIVE:   SUBJECTIVE STATEMENT: 1-2/10 pain today after cortisone injection from Dr. Iline Mallory  Eval:Right knee  swelling, having to drag the R leg due to not being able to bend the knee. Pt reports since the first of this month she has been doing better for the most part. Bone on bone on the right knee and left knee  PERTINENT HISTORY: -diabetes -COPD -Hypertension PAIN:  Are you having pain? Yes: NPRS scale: 8/10 pain at worst (couple months ago) Pain location: anterior right knee Pain description: sharp Aggravating factors: walking, walking up in the morning (last few days has not been hurting in the morning) Relieving factors: rest, pain meds, ice  PRECAUTIONS: None  RED FLAGS: Bowel or bladder incontinence: Yes: urinary incontinence (4-5 years)    WEIGHT BEARING RESTRICTIONS: No  FALLS:  Has patient fallen in last 6 months? No  OCCUPATION: substitute teaching  PLOF: Independent and Needs assistance with homemaking  PATIENT GOALS: get rid of the pain, like to be able to walk for trip coming up, endurance  NEXT MD VISIT: Cortizone shot next week  OBJECTIVE:  Note: Objective measures were completed at Evaluation unless otherwise noted.  DIAGNOSTIC FINDINGS: 2022 Clinical:  Right knee pain, giving way, no trauma   X-rays were done of the right knee, three views.   There is degenerative changes of the right knee, tricompartmental, moderate, more medially with small osteophyte formation.  No fracture or loose body noted.  Bone quality is good.  Impression:  Moderate tricompartmental degenerative changes of the right knee, more medially.  PATIENT SURVEYS:  LEFS 45/80  COGNITION: Overall cognitive status: Within functional limits for tasks assessed     SENSATION: WFL Light touch: Impaired  big toes tingling  EDEMA:  R knee visibly larger   PALPATION: No tenderness to palpation  LOWER EXTREMITY ROM:  Active ROM Right eval Left eval  Hip flexion    Hip extension    Hip abduction    Hip adduction    Hip internal rotation    Hip external rotation    Knee flexion  120, pain 122  Knee extension 7, difficult 8, difficult  Ankle dorsiflexion    Ankle plantarflexion    Ankle inversion    Ankle eversion     (Blank rows = not tested)  LOWER EXTREMITY MMT:  MMT Right eval Left eval  Hip flexion 3+ 4  Hip extension    Hip abduction 4 4  Hip adduction 4 4  Hip internal rotation    Hip external rotation    Knee flexion 4- 4  Knee extension 4- 4  Ankle dorsiflexion 4 4  Ankle plantarflexion    Ankle inversion    Ankle eversion     (Blank rows = not tested)   FUNCTIONAL TESTS:  5 times sit to stand: 15.47, pt states she is feeling much improved since last month 2 minute walk test: 410, knee pain 3-4/10,  SLS L: 14.69 seconds R: 8.40 seconds GAIT: Distance walked: 410 Assistive device utilized: None Level of assistance: Complete Independence Comments: decreased speed, slight limp (decreased RLE stance time after the first minute of )                                                                                                                                TREATMENT DATE:  04/16/24 Review of HEP and goals 2 sets of 5 sit to stand no UE assist SLS R leg 9" and then 11" Heel raises 2 x 10 Slant board 5 x 20" Hip vectors 3" hold x 5 each Ended on Nustep seat 9 x 5'    04/09/2024  Evaluation: -ROM measured, Strength assessed, HEP prescribed, pt educated on prognosis, findings, and importance of HEP compliance if given.  Manual Therapy: -CPA of Lumbar Spinal segments L3-L5, grade II-III mobilizations -STM of Lumbar Paraspinal musculature  Therapeutic Exercise: -Supine bridges 2 sets of 10 reps, 3 second holds, symptomatic, pt cued for max hip extension -Standing 3 way hip 1 sets 10 reps, bilaterally, pt cued for upright trunk and maintaining of neutral spine -Lateral stepping 3 laps 20 feet per lap, second 2 with RTB around ankles, pt cued for upright posture -Forward lunges, 1 set of 5 reps better performance going into RLE,  pt cued for core activation and upright posture       PATIENT EDUCATION:  Education details: Pt was educated on findings of PT evaluation,  prognosis, frequency of therapy visits and rationale, attendance policy, and HEP if given.   Person educated: Patient Education method: Explanation, Verbal cues, and Handouts Education comprehension: verbalized understanding and needs further education  HOME EXERCISE PROGRAM: Walking/Riding stationary bike 15 minutes a day  Access Code: [email or web address] URL: [email or web address] Date: 04/09/2024 Prepared by: Armond Bertin  Exercises - Standing Single Leg Stance with Counter Support  - 1 x daily - 7 x weekly - 3 sets - 10 reps - Sit to Stand with Arms Crossed  - 1 x daily - 7 x weekly - 3 sets - 10 reps  ASSESSMENT:  CLINICAL IMPRESSION: Today's session started with a review of HEP and goals; patient verbalizes agreement with set rehab goals. Updated HEP to include hip vectors and heel raises.  Improved SLS on right noted today.  Patient will benefit from continued skilled therapy servicesto address deficits and promote return to optimal function.      Eval:Patient is a 78 y.o. female who was seen today for physical therapy evaluation and treatment for M79.604,M79.605 (ICD-10-CM) - Bilateral leg pain.   Patient demonstrates decreased RLE strength, abnormal pain rating, and impaired balance. Patient also demonstrates difficulty with ambulation during today's session with decreased stride length and velocity noted. Patient also demonstrates decreased stance time on RLE towards the end of the 2 MWT. Patient requires education on physical therapy, prognosis, and POC. Patient would benefit from skilled physical therapy for increased endurance with ambulation, gait training with AD during ambulation, increased RLE strength, and balance for improved gait quality, return to higher level of function with ADLs, and progress towards therapy  goals.   OBJECTIVE IMPAIRMENTS: Abnormal gait, decreased activity tolerance, decreased balance, decreased endurance, decreased mobility, difficulty walking, decreased strength, and pain.   ACTIVITY LIMITATIONS: carrying, lifting, bending, standing, squatting, stairs, transfers, and locomotion level  PARTICIPATION LIMITATIONS: meal prep, cleaning, laundry, driving, shopping, community activity, and yard work  PERSONAL FACTORS: Age, Time since onset of injury/illness/exacerbation, and 1 comorbidity: COPD  are also affecting patient's functional outcome.   REHAB POTENTIAL: Good  CLINICAL DECISION MAKING: Stable/uncomplicated  EVALUATION COMPLEXITY: Low  HEP: Access Code: [email or web address] URL: [email or web address] Date: 04/09/2024 Prepared by: Armond Bertin  Exercises - Standing Single Leg Stance with Counter Support  - 1 x daily - 7 x weekly - 3 sets - 10 reps - Sit to Stand with Arms Crossed  - 1 x daily - 7 x weekly - 3 sets - 10 reps   GOALS: Goals reviewed with patient? No  SHORT TERM GOALS: Target date: 04/23/24  Patient will demonstrate evidence of independence with individualized HEP and will report compliance for at least 3 days per week for optimized progression towards remaining therapy goals. Baseline:  Goal status: IN PROGRESS  2.  Patient will report a decrease in pain level during community ambulation by at least 2 points for improved quality of life. Baseline: 4-5/10 Goal status: IN PROGRESS     LONG TERM GOALS: Target date: 05/07/24  Pt will demonstrate a an increase of at least 9 points on the LEFS for improved performance of community ambulation and ADL. Baseline: see objective Goal status: IN PROGRESS  2.  Pt will improve 2 MWT by 140 in order to demonstrate improved functional ambulatory capacity in community setting.  Baseline: see objective Goal status: IN PROGRESS  3.  Pt will demonstrate at least 4/5 MMT for right lower extremity for  increased strength during ADL and community ambulation. Baseline: see objective Goal status: IN  PROGRESS  5.  Pt will improve SLS on RLE to be equal to LLE in order to improve dynamic/functional balance during functional activities involving SLS. Baseline: see objective Goal status: IN PROGRESS    PLAN:  PT FREQUENCY: 1x/week  PT DURATION: 4 weeks  PLANNED INTERVENTIONS: 97110-Therapeutic exercises, 97530- Therapeutic activity, 97112- Neuromuscular re-education, 97535- Self Care, 60454- Manual therapy, [phone]- Gait training, Patient/Family education, Balance training, Stair training, Joint mobilization, DME instructions, Cryotherapy, and Moist heat  PLAN FOR NEXT SESSION: gait training with cane, progress LE strengthening and balance activities   Armond Bertin, PT, DPT [facility] Office: [phone] 3:17 PM, 04/16/24

## 2024-04-17 DIAGNOSIS — E119 Type 2 diabetes mellitus without complications: Secondary | ICD-10-CM | POA: Diagnosis not present

## 2024-04-17 DIAGNOSIS — I10 Essential (primary) hypertension: Secondary | ICD-10-CM | POA: Diagnosis not present

## 2024-04-20 LAB — AMB RESULTS CONSOLE CBG: Glucose: 99

## 2024-04-20 NOTE — Progress Notes (Signed)
 SDOH not assessed

## 2024-04-26 ENCOUNTER — Ambulatory Visit (HOSPITAL_COMMUNITY)
Admission: RE | Admit: 2024-04-26 | Discharge: 2024-04-26 | Disposition: A | Discharge status: Home / Self Care | Source: Ambulatory Visit | Attending: Orthopaedic Surgery | Admitting: Orthopaedic Surgery

## 2024-04-26 DIAGNOSIS — M948X6 Other specified disorders of cartilage, lower leg: Secondary | ICD-10-CM | POA: Diagnosis not present

## 2024-04-26 DIAGNOSIS — M25561 Pain in right knee: Secondary | ICD-10-CM | POA: Insufficient documentation

## 2024-04-26 DIAGNOSIS — G8929 Other chronic pain: Secondary | ICD-10-CM | POA: Diagnosis not present

## 2024-04-26 DIAGNOSIS — M25461 Effusion, right knee: Secondary | ICD-10-CM | POA: Diagnosis not present

## 2024-04-26 DIAGNOSIS — M6740 Ganglion, unspecified site: Secondary | ICD-10-CM | POA: Diagnosis not present

## 2024-04-26 DIAGNOSIS — M7121 Synovial cyst of popliteal space [Baker], right knee: Secondary | ICD-10-CM | POA: Diagnosis not present

## 2024-04-29 ENCOUNTER — Encounter (HOSPITAL_COMMUNITY)

## 2024-05-06 ENCOUNTER — Encounter (HOSPITAL_COMMUNITY): Payer: Self-pay

## 2024-05-06 ENCOUNTER — Encounter (HOSPITAL_COMMUNITY)

## 2024-05-06 ENCOUNTER — Ambulatory Visit (HOSPITAL_COMMUNITY): Attending: Orthopaedic Surgery

## 2024-05-06 DIAGNOSIS — R269 Unspecified abnormalities of gait and mobility: Secondary | ICD-10-CM | POA: Insufficient documentation

## 2024-05-06 DIAGNOSIS — M25561 Pain in right knee: Secondary | ICD-10-CM | POA: Diagnosis not present

## 2024-05-06 NOTE — Therapy (Signed)
 OUTPATIENT PHYSICAL THERAPY LOWER EXTREMITY EVALUATION   Patient Name: Lindsay Wilkerson MRN: [id number] DOB:July 24, 1946, 78 y.o., female Today's Date: 05/06/2024  END OF SESSION:  PT End of Session - 05/06/24 1704     Visit Number 3    Number of Visits 4    Date for PT Re-Evaluation 05/07/24    Authorization Type [employer] MEDIC[employer] CHOICE PPO    Authorization Time Period approved 8 visits from 4/18 to 5/21    Authorization - Visit Number 3    Progress Note Due on Visit 4    PT Start Time 1608    PT Stop Time 1646    PT Time Calculation (min) 38 min    Activity Tolerance Patient tolerated treatment well    Behavior During Therapy [facility] for tasks assessed/performed              Past Medical History:  Diagnosis Date   Depression, major    Diabetes mellitus without complication (HCC)    Hypertension    Urinary bladder incontinence    Past Surgical History:  Procedure Laterality Date   COLONOSCOPY WITH PROPOFOL  N/A 07/18/2022   Procedure: COLONOSCOPY WITH PROPOFOL ;  Surgeon: Urban Garden, MD;  [facility];  Service: Gastroenterology;  Laterality: N/A;  1035 ASA 1   EYE SURGERY     IR FALLOPIAN TUBE CATHETERIZATION Left    1980   POLYPECTOMY  07/18/2022   Procedure: POLYPECTOMY;  Surgeon: Urban Garden, MD;  [facility];  Service: Gastroenterology;;   TONSILLECTOMY     Age 4   Patient Active Problem List   Diagnosis Date Noted   RLQ abdominal pain 04/16/2023   Choking 05/23/2022   Abdominal pain, left lower quadrant 11/13/2020    PCP: Wyvonna Heidelberg, MD   REFERRING PROVIDER: Pleasant Brilliant, MD  REFERRING DIAG: M79.604,M79.605 (ICD-10-CM) - Bilateral leg pain  THERAPY DIAG:  Acute pain of right knee  Abnormality of gait and mobility  Rationale for Evaluation and Treatment: Rehabilitation  ONSET DATE: Couple of months ago  SUBJECTIVE:   SUBJECTIVE STATEMENT: Pt states she went down the steps and right  knee sort of gave out on her. Pt states the pain is a 0/10 at this time. Pt states she was around the halls today working but was pleased to have no pain. Pt reports some compliance with HEP.   Eval:Right knee swelling, having to drag the R leg due to not being able to bend the knee. Pt reports since the first of this month she has been doing better for the most part. Bone on bone on the right knee and left knee  PERTINENT HISTORY: -diabetes -COPD -Hypertension PAIN:  Are you having pain? Yes: NPRS scale: 8/10 pain at worst (couple months ago) Pain location: anterior right knee Pain description: sharp Aggravating factors: walking, walking up in the morning (last few days has not been hurting in the morning) Relieving factors: rest, pain meds, ice  PRECAUTIONS: None  RED FLAGS: Bowel or bladder incontinence: Yes: urinary incontinence (4-5 years)   WEIGHT BEARING RESTRICTIONS: No  FALLS:  Has patient fallen in last 6 months? No  OCCUPATION: substitute teaching  PLOF: Independent and Needs assistance with homemaking  PATIENT GOALS: get rid of the pain, like to be able to walk for trip coming up, endurance  NEXT MD VISIT: Cortizone shot next week  OBJECTIVE:  Note: Objective measures were completed at Evaluation unless otherwise noted.  DIAGNOSTIC FINDINGS: 2022 Clinical:  Right knee  pain, giving way, no trauma   X-rays were done of the right knee, three views.   There is degenerative changes of the right knee, tricompartmental, moderate, more medially with small osteophyte formation.  No fracture or loose body noted.  Bone quality is good.   Impression:  Moderate tricompartmental degenerative changes of the right knee, more medially.  PATIENT SURVEYS:  LEFS 45/80  COGNITION: Overall cognitive status: Within functional limits for tasks assessed     SENSATION: WFL Light touch: Impaired  big toes tingling  EDEMA:  R knee visibly larger   PALPATION: No tenderness  to palpation  LOWER EXTREMITY ROM:  Active ROM Right eval Left eval  Hip flexion    Hip extension    Hip abduction    Hip adduction    Hip internal rotation    Hip external rotation    Knee flexion 120, pain 122  Knee extension 7, difficult 8, difficult  Ankle dorsiflexion    Ankle plantarflexion    Ankle inversion    Ankle eversion     (Blank rows = not tested)  LOWER EXTREMITY MMT:  MMT Right eval Left eval  Hip flexion 3+ 4  Hip extension    Hip abduction 4 4  Hip adduction 4 4  Hip internal rotation    Hip external rotation    Knee flexion 4- 4  Knee extension 4- 4  Ankle dorsiflexion 4 4  Ankle plantarflexion    Ankle inversion    Ankle eversion     (Blank rows = not tested)   FUNCTIONAL TESTS:  5 times sit to stand: 15.47, pt states she is feeling much improved since last month 2 minute walk test: 410, knee pain 3-4/10,  SLS L: 14.69 seconds R: 8.40 seconds GAIT: Distance walked: 410 Assistive device utilized: None Level of assistance: Complete Independence Comments: decreased speed, slight limp (decreased RLE stance time after the first minute of )                                                                                                                                TREATMENT DATE:  05/06/2024  Therapeutic Exercise: -Bike level 3 resistance, 5 minutes -TKE on 6 inch step, 2 sets of 10 reps, pt cued for just touching of nonworking land -Lateral stepping 1 laps 20 feet per lap, with RTB around ankles, pt cued for upright posture -Standing 3 way hip 1 sets 10 reps, bilaterally, pt cued for upright trunk and maintaining of neutral spine  Therapeutic Activity: -Sit to stands, 2 sets of 4 reps, throughout session -Lateral step ups 1 set of 8 reps, 6 inch step pt cued for increased length     04/16/24 Review of HEP and goals 2 sets of 5 sit to stand no UE assist SLS R leg 9" and then 11" Heel raises 2 x 10 Slant board 5 x 20" Hip  vectors 3" hold x 5 each  Ended on Nustep seat 9 x 5'    04/09/2024  Evaluation: -ROM measured, Strength assessed, HEP prescribed, pt educated on prognosis, findings, and importance of HEP compliance if given.       PATIENT EDUCATION:  Education details: Pt was educated on findings of PT evaluation, prognosis, frequency of therapy visits and rationale, attendance policy, and HEP if given.   Person educated: Patient Education method: Explanation, Verbal cues, and Handouts Education comprehension: verbalized understanding and needs further education  HOME EXERCISE PROGRAM: Walking/Riding stationary bike 15 minutes a day  Access Code: [email or web address] URL: [email or web address] Date: 04/09/2024 Prepared by: Armond Bertin  Exercises - Standing Single Leg Stance with Counter Support  - 1 x daily - 7 x weekly - 3 sets - 10 reps - Sit to Stand with Arms Crossed  - 1 x daily - 7 x weekly - 3 sets - 10 reps  ASSESSMENT:  CLINICAL IMPRESSION: Patient continues to demonstrate progress with LE strength, gait quality and balance. Patient also demonstrates decreased pain during today's session even with advanced movements. Patient able to progress dynamic balance and core activation exercises today with lateral stepping and 3 way hip exercise, good performance with verbal cueing. Patient would continue to benefit from skilled physical therapy for increased endurance with ambulation, increased LE strength, and improved balance for improved quality of life, improved community ambulation and continued progress towards therapy goals.       Eval:Patient is a 78 y.o. female who was seen today for physical therapy evaluation and treatment for M79.604,M79.605 (ICD-10-CM) - Bilateral leg pain.   Patient demonstrates decreased RLE strength, abnormal pain rating, and impaired balance. Patient also demonstrates difficulty with ambulation during today's session with decreased stride length and  velocity noted. Patient also demonstrates decreased stance time on RLE towards the end of the 2 MWT. Patient requires education on physical therapy, prognosis, and POC. Patient would benefit from skilled physical therapy for increased endurance with ambulation, gait training with AD during ambulation, increased RLE strength, and balance for improved gait quality, return to higher level of function with ADLs, and progress towards therapy goals.   OBJECTIVE IMPAIRMENTS: Abnormal gait, decreased activity tolerance, decreased balance, decreased endurance, decreased mobility, difficulty walking, decreased strength, and pain.   ACTIVITY LIMITATIONS: carrying, lifting, bending, standing, squatting, stairs, transfers, and locomotion level  PARTICIPATION LIMITATIONS: meal prep, cleaning, laundry, driving, shopping, community activity, and yard work  PERSONAL FACTORS: Age, Time since onset of injury/illness/exacerbation, and 1 comorbidity: COPD are also affecting patient's functional outcome.   REHAB POTENTIAL: Good  CLINICAL DECISION MAKING: Stable/uncomplicated  EVALUATION COMPLEXITY: Low  HEP: Access Code: [email or web address] URL: [email or web address] Date: 04/09/2024 Prepared by: Armond Bertin  Exercises - Standing Single Leg Stance with Counter Support  - 1 x daily - 7 x weekly - 3 sets - 10 reps - Sit to Stand with Arms Crossed  - 1 x daily - 7 x weekly - 3 sets - 10 reps   GOALS: Goals reviewed with patient? No  SHORT TERM GOALS: Target date: 04/23/24  Patient will demonstrate evidence of independence with individualized HEP and will report compliance for at least 3 days per week for optimized progression towards remaining therapy goals. Baseline:  Goal status: IN PROGRESS  2.  Patient will report a decrease in pain level during community ambulation by at least 2 points for improved quality of life. Baseline: 4-5/10 Goal status: IN PROGRESS     LONG TERM GOALS: Target date:  05/07/24  Pt will demonstrate a  an increase of at least 9 points on the LEFS for improved performance of community ambulation and ADL. Baseline: see objective Goal status: IN PROGRESS  2.  Pt will improve 2 MWT by 140 in order to demonstrate improved functional ambulatory capacity in community setting.  Baseline: see objective Goal status: IN PROGRESS  3.  Pt will demonstrate at least 4/5 MMT for right lower extremity for increased strength during ADL and community ambulation. Baseline: see objective Goal status: IN PROGRESS  5.  Pt will improve SLS on RLE to be equal to LLE in order to improve dynamic/functional balance during functional activities involving SLS. Baseline: see objective Goal status: IN PROGRESS    PLAN:  PT FREQUENCY: 1x/week  PT DURATION: 4 weeks  PLANNED INTERVENTIONS: 97110-Therapeutic exercises, 97530- Therapeutic activity, [id number]- Neuromuscular re-education, 97535- Self Care, 16109- Manual therapy, [phone]- Gait training, Patient/Family education, Balance training, Stair training, Joint mobilization, DME instructions, Cryotherapy, and Moist heat  PLAN FOR NEXT SESSION: gait training with cane, progress LE strengthening and balance activities. Discharge next visit to HEP possibly, do have 4 more approved visits   Armond Bertin, PT, DPT [facility] Office: [phone] 5:06 PM, 05/06/24

## 2024-05-12 ENCOUNTER — Encounter (HOSPITAL_COMMUNITY)

## 2024-05-17 DIAGNOSIS — I10 Essential (primary) hypertension: Secondary | ICD-10-CM | POA: Diagnosis not present

## 2024-05-17 DIAGNOSIS — E119 Type 2 diabetes mellitus without complications: Secondary | ICD-10-CM | POA: Diagnosis not present

## 2024-06-09 DIAGNOSIS — J45909 Unspecified asthma, uncomplicated: Secondary | ICD-10-CM | POA: Diagnosis not present

## 2024-06-09 DIAGNOSIS — Z0001 Encounter for general adult medical examination with abnormal findings: Secondary | ICD-10-CM | POA: Diagnosis not present

## 2024-06-09 DIAGNOSIS — I10 Essential (primary) hypertension: Secondary | ICD-10-CM | POA: Diagnosis not present

## 2024-06-09 DIAGNOSIS — F419 Anxiety disorder, unspecified: Secondary | ICD-10-CM | POA: Diagnosis not present

## 2024-06-09 DIAGNOSIS — Z1389 Encounter for screening for other disorder: Secondary | ICD-10-CM | POA: Diagnosis not present

## 2024-06-09 DIAGNOSIS — K219 Gastro-esophageal reflux disease without esophagitis: Secondary | ICD-10-CM | POA: Diagnosis not present

## 2024-06-09 DIAGNOSIS — Z1331 Encounter for screening for depression: Secondary | ICD-10-CM | POA: Diagnosis not present

## 2024-06-09 DIAGNOSIS — E119 Type 2 diabetes mellitus without complications: Secondary | ICD-10-CM | POA: Diagnosis not present

## 2024-06-10 DIAGNOSIS — I10 Essential (primary) hypertension: Secondary | ICD-10-CM | POA: Diagnosis not present

## 2024-06-10 DIAGNOSIS — K219 Gastro-esophageal reflux disease without esophagitis: Secondary | ICD-10-CM | POA: Diagnosis not present

## 2024-06-10 DIAGNOSIS — E1165 Type 2 diabetes mellitus with hyperglycemia: Secondary | ICD-10-CM | POA: Diagnosis not present

## 2024-06-23 NOTE — Progress Notes (Signed)
 The patient attended a screening event on 04/17/2024, where her blood pressure was measured at 114/69 mmHg, and her non-fasting blood glucose was 99 mg/dl. During the event, the patient reported that she does not smoke, has Medicare and Private/VA insurance, is established with a primary care provider (Fanta?), and does not have any SDOH needs.   A chart review confirmed that the patient does have an active PCP, Dr. Tesfaye Fanta. Her last office visit was on 04/17/2024. The pt currently does not have any upcoming [facility] visible appts. Chart review further indicates that the pt has Medicare for insurance. The patient demonstrates consistent engagement in her care.    At this time, no additional support from the Health Equity Team is indicated.

## 2024-06-26 ENCOUNTER — Encounter: Payer: Self-pay | Admitting: Emergency Medicine

## 2024-06-26 ENCOUNTER — Ambulatory Visit
Admission: EM | Admit: 2024-06-26 | Discharge: 2024-06-26 | Disposition: A | Discharge status: Home / Self Care | Source: Ambulatory Visit | Attending: Nurse Practitioner | Admitting: Nurse Practitioner

## 2024-06-26 DIAGNOSIS — R1032 Left lower quadrant pain: Secondary | ICD-10-CM | POA: Insufficient documentation

## 2024-06-26 DIAGNOSIS — Z8719 Personal history of other diseases of the digestive system: Secondary | ICD-10-CM | POA: Diagnosis not present

## 2024-06-26 HISTORY — DX: Diverticulitis of intestine, part unspecified, without perforation or abscess without bleeding: K57.92

## 2024-06-26 LAB — POCT URINALYSIS DIP (MANUAL ENTRY)
Blood, UA: NEGATIVE
Glucose, UA: NEGATIVE mg/dL
Ketones, POC UA: NEGATIVE mg/dL
Nitrite, UA: NEGATIVE
Protein Ur, POC: NEGATIVE mg/dL
Spec Grav, UA: >=1.03 — AB (ref 1.010–1.025)
Urobilinogen, UA: 1 U/dL
pH, UA: 5.5 (ref 5.0–8.0)

## 2024-06-26 MED ORDER — MOXIFLOXACIN HCL 400 MG PO TABS
400.0000 mg | ORAL_TABLET | Freq: Every day | ORAL | 0 refills | Status: AC
Start: 2024-06-26 — End: 2024-07-03

## 2024-06-26 NOTE — ED Provider Notes (Signed)
 [facility]    CSN: [id number] Arrival date & time: 06/26/24  1521      History   Chief Complaint No chief complaint on file.   HPI Lindsay Wilkerson is a 78 y.o. female.   The history is provided by the patient.   Patient presents with a 2-week history of left lower quadrant abdominal pain.  Patient states that the pain feels like a dull ache at this time.  She rates her pain 3-4/10.  She denies fever, chills, nausea, vomiting, gas, bloating, dysuria, flank pain, low back pain, or hematuria.  She does endorse urinary frequency.  Patient also reports prior history of reflux symptoms, but states that she currently does not take any medication for her symptoms.  States that she did take Tylenol  on 1 occasion for her abdominal pain with minimal relief of her symptoms.  Patient reports prior history of diverticulitis.  States that her symptoms feel similar to a prior episode.  States her last episode was approximately 3 years ago.  Past Medical History:  Diagnosis Date   Depression, major    Diabetes mellitus without complication (HCC)    Diverticulitis    Hypertension    Urinary bladder incontinence     Patient Active Problem List   Diagnosis Date Noted   RLQ abdominal pain 04/16/2023   Choking 05/23/2022   Abdominal pain, left lower quadrant 11/13/2020    Past Surgical History:  Procedure Laterality Date   COLONOSCOPY WITH PROPOFOL  N/A 07/18/2022   Procedure: COLONOSCOPY WITH PROPOFOL ;  Surgeon: Eartha Angelia Sieving, MD;  Location: [facility];  Service: Gastroenterology;  Laterality: N/A;  1035 ASA 1   EYE SURGERY     IR FALLOPIAN TUBE CATHETERIZATION Left    1980   POLYPECTOMY  07/18/2022   Procedure: POLYPECTOMY;  Surgeon: Eartha Angelia Sieving, MD;  Location: [facility];  Service: Gastroenterology;;   TONSILLECTOMY     Age 66    OB History     Gravida  3   Para      Term      Preterm      AB  3   Living  0      SAB      IAB       Ectopic      Multiple      Live Births               Home Medications    Prior to Admission medications   Medication Sig Start Date End Date Taking? Authorizing Provider  moxifloxacin  (AVELOX ) 400 MG tablet Take 1 tablet (400 mg total) by mouth daily at 8 pm for 7 days. 06/26/24 07/03/24 Yes Leath-Warren, Etta PARAS, NP  atenolol-chlorthalidone (TENORETIC) 50-25 MG per tablet Take 1 tablet by mouth daily.      [provider]  cetirizine  (ZYRTEC ) 5 MG tablet Take 1 tablet (5 mg total) by mouth daily. 07/01/22   Christopher Savannah, PA-C  clonazePAM (KLONOPIN) 0.5 MG tablet Take 0.5 tablets by mouth daily as needed (anxiety).    [provider]  diazepam  (VALIUM ) 2 MG tablet Take one tablet at bedtime for neck spasm. 07/09/23   Brenna Lin, MD  fluticasone  [facility]) 50 MCG/ACT nasal spray Place 2 sprays into both nostrils daily.    [provider]  metformin (FORTAMET) 500 MG (OSM) 24 hr tablet Take 500 mg by mouth 2 (two) times daily with a meal.    [provider]  mirabegron  ER (  MYRBETRIQ ) 25 MG TB24 tablet Take 1 tablet (25 mg total) by mouth daily. 05/21/23 08/19/23  Ozan, Jennifer, DO  potassium chloride  SA (KLOR-CON  M) 20 MEQ tablet Take 1 tablet (20 mEq total) by mouth daily. 07/30/22   Towana Ozell BROCKS, MD  TRELEGY ELLIPTA 100-62.5-25 MCG/ACT AEPB Inhale 1 puff into the lungs daily. 01/03/22   [provider]  triamcinolone  ointment (KENALOG ) 0.5 % Pea-sized amount to affected area twice weekly as needed 05/21/23   Ozan, Jennifer, DO    Family History Family History  Problem Relation Age of Onset   Heart disease Father    Hypertension Mother    Heart disease Mother    Heart disease Brother    Hypertension Brother    Hypertension Sister    Kidney disease Sister    Breast cancer Sister    Rheum arthritis Sister    Memory loss Sister     Social History Social History   Tobacco Use   Smoking status: Former    Types: Cigarettes    Smokeless tobacco: Never  Vaping Use   Vaping status: Never Used  Substance Use Topics   Alcohol  use: Yes    Comment: occasionally   Drug use: No     Allergies   Amoxicillin, Augmentin [amoxicillin-pot clavulanate], Erythromycin, Sulfa antibiotics, Sulfasalazine, Tetanus toxoids, and Tussionex pennkinetic er [hydrocod poli-chlorphe poli er]   Review of Systems Review of Systems Per HPI  Physical Exam Triage Vital Signs ED Triage Vitals  Encounter Vitals Group     BP 06/26/24 1532 (!) 164/101     Girls Systolic BP Percentile --      Girls Diastolic BP Percentile --      Boys Systolic BP Percentile --      Boys Diastolic BP Percentile --      Pulse Rate 06/26/24 1532 73     Resp 06/26/24 1532 20     Temp 06/26/24 1532 98.2 F (36.8 C)     Temp Source 06/26/24 1532 Oral     SpO2 06/26/24 1532 97 %     Weight --      Height --      Head Circumference --      Peak Flow --      Pain Score 06/26/24 1533 3     Pain Loc --      Pain Education --      Exclude from Growth Chart --    No data found.  Updated Vital Signs BP (!) 164/101 (BP Location: Right Wrist)   Pulse 73   Temp 98.2 F (36.8 C) (Oral)   Resp 20   SpO2 97%   Visual Acuity Right Eye Distance:   Left Eye Distance:   Bilateral Distance:    Right Eye Near:   Left Eye Near:    Bilateral Near:     Physical Exam Vitals and nursing note reviewed.  Constitutional:      General: She is not in acute distress.    Appearance: Normal appearance.  HENT:     Head: Normocephalic.  Eyes:     Extraocular Movements: Extraocular movements intact.     Conjunctiva/sclera: Conjunctivae normal.     Pupils: Pupils are equal, round, and reactive to light.  Cardiovascular:     Rate and Rhythm: Normal rate and regular rhythm.     Pulses: Normal pulses.     Heart sounds: Normal heart sounds.  Pulmonary:     Effort: Pulmonary effort is normal. No respiratory distress.  Breath sounds: Normal breath sounds. No  stridor. No wheezing, rhonchi or rales.  Abdominal:     General: Bowel sounds are normal.     Palpations: Abdomen is soft.     Tenderness: There is abdominal tenderness.  Musculoskeletal:     Cervical back: Normal range of motion.  Lymphadenopathy:     Cervical: No cervical adenopathy.  Skin:    General: Skin is warm and dry.  Neurological:     General: No focal deficit present.     Mental Status: She is alert and oriented to person, place, and time.  Psychiatric:        Mood and Affect: Mood normal.        Behavior: Behavior normal.      UC Treatments / Results  Labs (all labs ordered are listed, but only abnormal results are displayed) Labs Reviewed  POCT URINALYSIS DIP (MANUAL ENTRY) - Abnormal; Notable for the following components:      Result Value   Clarity, UA cloudy (*)    Bilirubin, UA small (*)    Spec Grav, UA >=1.030 (*)    Leukocytes, UA Trace (*)    All other components within normal limits  URINE CULTURE  COMPREHENSIVE METABOLIC PANEL WITH GFR  CBC WITH DIFFERENTIAL/PLATELET  LIPASE    EKG   Radiology No results found.  Procedures Procedures (including critical care time)  Medications Ordered in UC Medications - No data to display  Initial Impression / Assessment and Plan / UC Course  I have reviewed the triage vital signs and the nursing notes.  Pertinent labs & imaging results that were available during my care of the patient were reviewed by me and considered in my medical decision making (see chart for details).  Patient with 2-week history of left lower quadrant abdominal pain.  She reports prior history of diverticulitis.  States that symptoms feel very similar.  Urinalysis does not show an obvious UTI; however, urine culture and CBC, CMP, and lipase are pending.  Will start patient on moxifloxacin  400 mg empirically to cover for possible diverticulitis.  Patient was given supportive care recommendations to include fluids, rest, and  over-the-counter analgesics.  Discussed strict ER follow-up precautions with patient.  Patient was advised to follow-up with her PCP if symptoms fail to improve, recommend discussing referral to gastroenterology at that time.  Patient was in agreement with this plan of care and verbalizes understanding.  All questions were answered.  Patient stable for discharge.  Final Clinical Impressions(s) / UC Diagnoses   Final diagnoses:  LLQ abdominal pain  History of diverticulitis     Discharge Instructions      Urine culture and blood work are pending.  You will be contacted if the pending test results are abnormal.  You will also access to the results via MyChart. Take medication as prescribed. Increase fluids and allow for plenty of rest.  Your urine shows you are mildly dehydrated.  Make sure you are drinking at least 8-10 8 ounce glasses of water  daily. You may take over-the-counter Tylenol  as needed for pain, fever, or general discomfort. Recommend a bland diet until symptoms improve.  You can also try a clear liquid diet to include chicken noodle soup, chicken broth, Jell-O, ginger ale, or Sprite until symptoms improve. Go to the emergency department immediately if you experience worsening abdominal pain with fever, chills, nausea, vomiting, or diarrhea. If your symptoms fail to improve, please follow-up with your primary care physician to discuss possible referral  to gastroenterology. Follow-up as needed.     ED Prescriptions     Medication Sig Dispense Auth. Provider   moxifloxacin  (AVELOX ) 400 MG tablet Take 1 tablet (400 mg total) by mouth daily at 8 pm for 7 days. 7 tablet Leath-Warren, Etta PARAS, NP      PDMP not reviewed this encounter.   Gilmer Etta PARAS, NP 06/26/24 1554

## 2024-06-26 NOTE — ED Triage Notes (Signed)
Left lower abd pain x 2 weeks

## 2024-06-26 NOTE — Discharge Instructions (Addendum)
 Urine culture and blood work are pending.  You will be contacted if the pending test results are abnormal.  You will also access to the results via MyChart. Take medication as prescribed. Increase fluids and allow for plenty of rest.  Your urine shows you are mildly dehydrated.  Make sure you are drinking at least 8-10 8 ounce glasses of water  daily. You may take over-the-counter Tylenol  as needed for pain, fever, or general discomfort. Recommend a bland diet until symptoms improve.  You can also try a clear liquid diet to include chicken noodle soup, chicken broth, Jell-O, ginger ale, or Sprite until symptoms improve. Go to the emergency department immediately if you experience worsening abdominal pain with fever, chills, nausea, vomiting, or diarrhea. If your symptoms fail to improve, please follow-up with your primary care physician to discuss possible referral to gastroenterology. Follow-up as needed.

## 2024-06-27 LAB — CBC WITH DIFFERENTIAL/PLATELET
Basophils Absolute: 0 x10E3/uL (ref 0.0–0.2)
Basos: 0 %
EOS (ABSOLUTE): 0 x10E3/uL (ref 0.0–0.4)
Eos: 1 %
Hematocrit: 43.6 % (ref 34.0–46.6)
Hemoglobin: 13.7 g/dL (ref 11.1–15.9)
Immature Grans (Abs): 0 x10E3/uL (ref 0.0–0.1)
Immature Granulocytes: 0 %
Lymphocytes Absolute: 1.9 x10E3/uL (ref 0.7–3.1)
Lymphs: 42 %
MCH: 27.4 pg (ref 26.6–33.0)
MCHC: 31.4 g/dL — ABNORMAL LOW (ref 31.5–35.7)
MCV: 87 fL (ref 79–97)
Monocytes Absolute: 0.3 x10E3/uL (ref 0.1–0.9)
Monocytes: 7 %
Neutrophils Absolute: 2.2 x10E3/uL (ref 1.4–7.0)
Neutrophils: 50 %
Platelets: 269 x10E3/uL (ref 150–450)
RBC: 5 x10E6/uL (ref 3.77–5.28)
RDW: 14.1 % (ref 11.7–15.4)
WBC: 4.4 x10E3/uL (ref 3.4–10.8)

## 2024-06-27 LAB — COMPREHENSIVE METABOLIC PANEL WITH GFR
ALT: 12 IU/L (ref 0–32)
AST: 14 IU/L (ref 0–40)
Albumin: 4.2 g/dL (ref 3.8–4.8)
Alkaline Phosphatase: 71 IU/L (ref 44–121)
BUN/Creatinine Ratio: 18 (ref 12–28)
BUN: 17 mg/dL (ref 8–27)
Bilirubin Total: 0.8 mg/dL (ref 0.0–1.2)
CO2: 21 mmol/L (ref 20–29)
Calcium: 9.8 mg/dL (ref 8.7–10.3)
Chloride: 106 mmol/L (ref 96–106)
Creatinine, Ser: 0.92 mg/dL (ref 0.57–1.00)
Globulin, Total: 2.6 g/dL (ref 1.5–4.5)
Glucose: 94 mg/dL (ref 70–99)
Potassium: 3.5 mmol/L (ref 3.5–5.2)
Sodium: 147 mmol/L — ABNORMAL HIGH (ref 134–144)
Total Protein: 6.8 g/dL (ref 6.0–8.5)
eGFR: 64 mL/min/1.73 (ref >59)

## 2024-06-27 LAB — LIPASE: Lipase: 15 U/L (ref 14–85)

## 2024-06-28 ENCOUNTER — Ambulatory Visit (HOSPITAL_COMMUNITY): Payer: Self-pay

## 2024-06-28 LAB — URINE CULTURE

## 2024-07-09 ENCOUNTER — Ambulatory Visit
Admission: EM | Admit: 2024-07-09 | Discharge: 2024-07-09 | Disposition: A | Discharge status: Home / Self Care | Attending: Nurse Practitioner | Admitting: Nurse Practitioner

## 2024-07-09 DIAGNOSIS — R21 Rash and other nonspecific skin eruption: Secondary | ICD-10-CM | POA: Diagnosis not present

## 2024-07-09 MED ORDER — DEXAMETHASONE SODIUM PHOSPHATE 10 MG/ML IJ SOLN
10.0000 mg | INTRAMUSCULAR | Status: AC
  Administered 2024-07-09: 10 mg via INTRAMUSCULAR

## 2024-07-09 MED ORDER — PREDNISONE 20 MG PO TABS: 40.0000 mg | ORAL_TABLET | Freq: Every day | ORAL | 0 refills | Status: AC

## 2024-07-09 MED ORDER — TRIAMCINOLONE ACETONIDE 0.1 % EX CREA: 1.0000 | TOPICAL_CREAM | Freq: Two times a day (BID) | CUTANEOUS | 0 refills | Status: AC

## 2024-07-09 NOTE — ED Triage Notes (Signed)
 Pt presents with itchy, small red possible insect bites all over arms and legs x 2 days. Putting OTC anti-itching cream with no relief of symptoms.

## 2024-07-09 NOTE — Discharge Instructions (Signed)
 You were given an injection of Decadron  10 mg.  Start the prednisone  on 07/10/2024.  Monitor your blood glucose levels while you are taking the steroid.  If your blood sugar exceeds 400, stop the medication and follow-up with your primary care physician. Take medication as prescribed. You may take over-the-counter Zyrtec , Claritin, or Allegra during the daytime and Benadryl at bedtime to help with itching. Avoid hot baths or showers while symptoms persist.  Recommend taking lukewarm baths. May apply cool cloths to the area to help with itching or discomfort. Avoid scratching, rubbing, or manipulating the areas while symptoms persist. Recommend Aveeno colloidal oatmeal bath to use to help with drying and itching. Follow up if symptoms do not improve.

## 2024-07-09 NOTE — ED Provider Notes (Signed)
 [facility]    CSN: [id number] Arrival date & time: 07/09/24  1854      History   Chief Complaint Chief Complaint  Patient presents with   Insect Bite    HPI Lindsay Wilkerson is a 78 y.o. female.   The history is provided by the patient.   Patient presents for complaints of rash to her arms and legs has been present for the past several days.  Patient states that she was out of town and used a new body wash at someone's home she was visiting, states that the next day, she developed a rash after returning home.  Patient states that the rash is itchy.  She cannot determine any other new exposures to include medications, foods, lotions, or detergents.  Patient further denies fever, chills, chest pain, abdominal pain, nausea, or vomiting.  Patient states that she has been using a topical medication to the affected areas with minimal relief of her symptoms.  Patient reports that she does have underlying history of diabetes, states her most recent A1c was under 6.5.  Past Medical History:  Diagnosis Date   Depression, major    Diabetes mellitus without complication (HCC)    Diverticulitis    Hypertension    Urinary bladder incontinence     Patient Active Problem List   Diagnosis Date Noted   RLQ abdominal pain 04/16/2023   Choking 05/23/2022   Abdominal pain, left lower quadrant 11/13/2020    Past Surgical History:  Procedure Laterality Date   COLONOSCOPY WITH PROPOFOL  N/A 07/18/2022   Procedure: COLONOSCOPY WITH PROPOFOL ;  Surgeon: Eartha Angelia Sieving, MD;  Location: [facility];  Service: Gastroenterology;  Laterality: N/A;  1035 ASA 1   EYE SURGERY     IR FALLOPIAN TUBE CATHETERIZATION Left    1980   POLYPECTOMY  07/18/2022   Procedure: POLYPECTOMY;  Surgeon: Eartha Angelia Sieving, MD;  Location: [facility];  Service: Gastroenterology;;   TONSILLECTOMY     Age 2    OB History     Gravida  3   Para      Term      Preterm      AB  3    Living  0      SAB      IAB      Ectopic      Multiple      Live Births               Home Medications    Prior to Admission medications   Medication Sig Start Date End Date Taking? Authorizing Provider  atenolol-chlorthalidone (TENORETIC) 50-25 MG per tablet Take 1 tablet by mouth daily.     Yes [provider]  fluticasone  (FLONASE) 50 MCG/ACT nasal spray Place 2 sprays into both nostrils daily.   Yes [provider]  metformin (FORTAMET) 500 MG (OSM) 24 hr tablet Take 500 mg by mouth 2 (two) times daily with a meal.   Yes [provider]  predniSONE  (DELTASONE ) 20 MG tablet Take 2 tablets (40 mg total) by mouth daily with breakfast for 5 days. 07/09/24 07/14/24 Yes Leath-Warren, Etta PARAS, NP  TRELEGY ELLIPTA 100-62.5-25 MCG/ACT AEPB Inhale 1 puff into the lungs daily. 01/03/22  Yes [provider]  triamcinolone  cream (KENALOG ) 0.1 % Apply 1 Application topically 2 (two) times daily. 07/09/24  Yes Leath-Warren, Etta PARAS, NP  cetirizine  (ZYRTEC ) 5 MG tablet Take 1 tablet (5 mg total) by mouth daily. 07/01/22  Christopher Savannah, PA-C  clonazePAM (KLONOPIN) 0.5 MG tablet Take 0.5 tablets by mouth daily as needed (anxiety).    [provider]  diazepam  (VALIUM ) 2 MG tablet Take one tablet at bedtime for neck spasm. 07/09/23   Brenna Lin, MD  mirabegron  ER (MYRBETRIQ ) 25 MG TB24 tablet Take 1 tablet (25 mg total) by mouth daily. 05/21/23 08/19/23  Ozan, Jennifer, DO  potassium chloride  SA (KLOR-CON  M) 20 MEQ tablet Take 1 tablet (20 mEq total) by mouth daily. 07/30/22   Towana Ozell BROCKS, MD    Family History Family History  Problem Relation Age of Onset   Heart disease Father    Hypertension Mother    Heart disease Mother    Heart disease Brother    Hypertension Brother    Hypertension Sister    Kidney disease Sister    Breast cancer Sister    Rheum arthritis Sister    Memory loss Sister     Social History Social History    Tobacco Use   Smoking status: Former    Types: Cigarettes   Smokeless tobacco: Never  Vaping Use   Vaping status: Never Used  Substance Use Topics   Alcohol  use: Yes    Comment: occasionally   Drug use: No     Allergies   Amoxicillin, Augmentin [amoxicillin-pot clavulanate], Erythromycin, Sulfa antibiotics, Sulfasalazine, Tetanus toxoids, and Tussionex pennkinetic er [hydrocod poli-chlorphe poli er]   Review of Systems Review of Systems Per HPI  Physical Exam Triage Vital Signs ED Triage Vitals  Encounter Vitals Group     BP 07/09/24 1858 (!) 153/71     Girls Systolic BP Percentile --      Girls Diastolic BP Percentile --      Boys Systolic BP Percentile --      Boys Diastolic BP Percentile --      Pulse Rate 07/09/24 1858 78     Resp 07/09/24 1858 18     Temp 07/09/24 1858 97.8 F (36.6 C)     Temp Source 07/09/24 1858 Oral     SpO2 07/09/24 1858 96 %     Weight --      Height --      Head Circumference --      Peak Flow --      Pain Score 07/09/24 1900 0     Pain Loc --      Pain Education --      Exclude from Growth Chart --    No data found.  Updated Vital Signs BP (!) 153/71 (BP Location: Right Arm)   Pulse 78   Temp 97.8 F (36.6 C) (Oral)   Resp 18   SpO2 96%   Visual Acuity Right Eye Distance:   Left Eye Distance:   Bilateral Distance:    Right Eye Near:   Left Eye Near:    Bilateral Near:     Physical Exam Vitals and nursing note reviewed.  Constitutional:      General: She is not in acute distress.    Appearance: Normal appearance.  HENT:     Head: Normocephalic.  Eyes:     Extraocular Movements: Extraocular movements intact.     Pupils: Pupils are equal, round, and reactive to light.  Cardiovascular:     Rate and Rhythm: Normal rate and regular rhythm.     Pulses: Normal pulses.     Heart sounds: Normal heart sounds.  Pulmonary:     Effort: Pulmonary effort is normal.  Breath sounds: Normal breath sounds.   Musculoskeletal:     Cervical back: Normal range of motion.  Skin:    General: Skin is warm and dry.     Findings: Rash present.     Comments: Hives noted to the bilateral upper extremities and lower extremities.  Rashes and no congruent pattern.  There is no oozing, fluctuance, or drainage present.  Neurological:     General: No focal deficit present.     Mental Status: She is alert and oriented to person, place, and time.  Psychiatric:        Mood and Affect: Mood normal.        Behavior: Behavior normal.      UC Treatments / Results  Labs (all labs ordered are listed, but only abnormal results are displayed) Labs Reviewed - No data to display  EKG   Radiology No results found.  Procedures Procedures (including critical care time)  Medications Ordered in UC Medications  dexamethasone  (DECADRON ) injection 10 mg (10 mg Intramuscular Given 07/09/24 1917)    Initial Impression / Assessment and Plan / UC Course  I have reviewed the triage vital signs and the nursing notes.  Pertinent labs & imaging results that were available during my care of the patient were reviewed by me and considered in my medical decision making (see chart for details).  Patient with rash of nonspecific skin eruption, consistent with hives noted to the bilateral upper and lower extremities.  Decadron  10 mg IM administered.  Will start prednisone  40 mg for the next 5 days along with triamcinolone  cream 0.1% for patient to apply topically.  Patient was advised to monitor her blood glucose levels, she was given parameters of when to discontinue the prednisone  if her blood glucose becomes elevated.  Supportive care recommendations were provided and discussed with the patient to include over-the-counter antihistamines, applying cool cloths to the affected areas, and avoiding hot baths or showers.  Patient was given indications regarding follow-up.  Patient was in agreement with this plan of care and verbalizes  understanding.  All questions were answered.  Patient stable for discharge.  Final Clinical Impressions(s) / UC Diagnoses   Final diagnoses:  Rash and nonspecific skin eruption     Discharge Instructions      You were given an injection of Decadron  10 mg.  Start the prednisone  on 07/10/2024.  Monitor your blood glucose levels while you are taking the steroid.  If your blood sugar exceeds 400, stop the medication and follow-up with your primary care physician. Take medication as prescribed. You may take over-the-counter Zyrtec , Claritin, or Allegra during the daytime and Benadryl at bedtime to help with itching. Avoid hot baths or showers while symptoms persist.  Recommend taking lukewarm baths. May apply cool cloths to the area to help with itching or discomfort. Avoid scratching, rubbing, or manipulating the areas while symptoms persist. Recommend Aveeno colloidal oatmeal bath to use to help with drying and itching. Follow up if symptoms do not improve.      ED Prescriptions     Medication Sig Dispense Auth. Provider   predniSONE  (DELTASONE ) 20 MG tablet Take 2 tablets (40 mg total) by mouth daily with breakfast for 5 days. 10 tablet Leath-Warren, Etta PARAS, NP   triamcinolone  cream (KENALOG ) 0.1 % Apply 1 Application topically 2 (two) times daily. 453.6 g Leath-Warren, Etta PARAS, NP      PDMP not reviewed this encounter.   Gilmer Etta PARAS, NP 07/09/24 1923

## 2024-07-10 ENCOUNTER — Ambulatory Visit

## 2024-07-10 DIAGNOSIS — I10 Essential (primary) hypertension: Secondary | ICD-10-CM | POA: Diagnosis not present

## 2024-07-10 DIAGNOSIS — E119 Type 2 diabetes mellitus without complications: Secondary | ICD-10-CM | POA: Diagnosis not present

## 2024-08-02 ENCOUNTER — Other Ambulatory Visit (HOSPITAL_COMMUNITY): Payer: Self-pay | Admitting: Gerontology

## 2024-08-02 ENCOUNTER — Ambulatory Visit (HOSPITAL_COMMUNITY)
Admission: RE | Admit: 2024-08-02 | Discharge: 2024-08-02 | Disposition: A | Discharge status: Home / Self Care | Source: Ambulatory Visit | Attending: Gerontology | Admitting: Gerontology

## 2024-08-02 DIAGNOSIS — M545 Low back pain, unspecified: Secondary | ICD-10-CM | POA: Insufficient documentation

## 2024-08-02 DIAGNOSIS — M47816 Spondylosis without myelopathy or radiculopathy, lumbar region: Secondary | ICD-10-CM | POA: Diagnosis not present

## 2024-08-02 DIAGNOSIS — M5136 Other intervertebral disc degeneration, lumbar region with discogenic back pain only: Secondary | ICD-10-CM | POA: Diagnosis not present

## 2024-08-02 DIAGNOSIS — M5126 Other intervertebral disc displacement, lumbar region: Secondary | ICD-10-CM | POA: Diagnosis not present

## 2024-08-02 DIAGNOSIS — M5127 Other intervertebral disc displacement, lumbosacral region: Secondary | ICD-10-CM | POA: Diagnosis not present

## 2024-08-09 ENCOUNTER — Telehealth: Payer: Self-pay | Admitting: Orthopaedic Surgery

## 2024-08-09 NOTE — Telephone Encounter (Signed)
 Dr. Janae pt - pt lvm stating that Dr. Carlette sent her for a back x-ray.  She would like Dr. MARLA to review it and see what he thinks.  She had it on 08/02/24 and it has not finalized yet.  She is having to slide off the bed due to the back pain.  [phone]

## 2024-08-10 DIAGNOSIS — E119 Type 2 diabetes mellitus without complications: Secondary | ICD-10-CM | POA: Diagnosis not present

## 2024-08-10 DIAGNOSIS — I10 Essential (primary) hypertension: Secondary | ICD-10-CM | POA: Diagnosis not present

## 2024-08-13 ENCOUNTER — Encounter: Payer: Self-pay | Admitting: Radiology

## 2024-09-10 DIAGNOSIS — E119 Type 2 diabetes mellitus without complications: Secondary | ICD-10-CM | POA: Diagnosis not present

## 2024-09-10 DIAGNOSIS — I10 Essential (primary) hypertension: Secondary | ICD-10-CM | POA: Diagnosis not present

## 2024-09-22 ENCOUNTER — Encounter: Payer: Self-pay | Admitting: Orthopaedic Surgery

## 2024-09-22 ENCOUNTER — Ambulatory Visit: Admitting: Orthopaedic Surgery

## 2024-09-22 DIAGNOSIS — M545 Low back pain, unspecified: Secondary | ICD-10-CM

## 2024-09-22 NOTE — Progress Notes (Signed)
 Her back has good and bad days.  She has had more good days recently.  She has no weakness or trauma.  I have encouraged her to do walking exercises.  Spine/Pelvis examination:  Inspection:  Overall, sacoiliac joint benign and hips nontender; without crepitus or defects.   Thoracic spine inspection: Alignment normal without kyphosis present   Lumbar spine inspection:  Alignment  with normal lumbar lordosis, without scoliosis apparent.   Thoracic spine palpation:  without tenderness of spinal processes   Lumbar spine palpation: without tenderness of lumbar area; without tightness of lumbar muscles    Range of Motion:   Lumbar flexion, forward flexion is normal without pain or tenderness    Lumbar extension is full without pain or tenderness   Left lateral bend is normal without pain or tenderness   Right lateral bend is normal without pain or tenderness   Straight leg raising is normal  Strength & tone: normal   Stability overall normal stability  Encounter Diagnosis  Name Primary?   Lumbar pain Yes   I have informed the patient I will be retiring from medical practice and from this office on September 23, 2024.  The patient has been offered continuing care with Dr. Margrette or Dr. Onesimo of this office.  The patient may choose another provider and the records will be forwarded after proper signature and notification.  Patient understands and agrees.  Return prn.  Call if any problem.  Precautions discussed.  Electronically Signed Lemond Stable, MD 10/1/202511:20 AM

## 2024-09-29 ENCOUNTER — Ambulatory Visit: Admitting: Internal Medicine

## 2024-10-05 ENCOUNTER — Telehealth: Payer: Self-pay | Admitting: Orthopedic Surgery

## 2024-10-05 NOTE — Telephone Encounter (Signed)
 Dr. Areatha pt - former Dr. MARLA pt, pt has an appt 10/20/24 to establish w/Dr. VEAR.  She lvm requesting a referral for PT for her back.

## 2024-10-06 NOTE — Telephone Encounter (Signed)
 Can we order PT now or when we see her ?

## 2024-10-06 NOTE — Telephone Encounter (Signed)
 LM For her to advise can order PT at [facility] 10/20/24 added to appointment notes so I wont forget

## 2024-10-10 DIAGNOSIS — I10 Essential (primary) hypertension: Secondary | ICD-10-CM | POA: Diagnosis not present

## 2024-10-10 DIAGNOSIS — E119 Type 2 diabetes mellitus without complications: Secondary | ICD-10-CM | POA: Diagnosis not present

## 2024-10-20 ENCOUNTER — Ambulatory Visit: Admitting: Orthopedic Surgery

## 2024-10-25 ENCOUNTER — Encounter: Payer: Self-pay | Admitting: Radiology

## 2024-11-01 ENCOUNTER — Telehealth: Payer: Self-pay | Admitting: Orthopedic Surgery

## 2024-11-01 NOTE — Telephone Encounter (Signed)
 This patient is a former Dr. MARLA pt, not very nice.  She had an appointment with you on 10/20/24 to establish care, she cancelled via mychart.  She is now wanting to come and see you for her rt knee and her tail bone.  I explained that our providers will see one issue per visit, she said that doesn't work for her.  I explained that was our protocol.  I told her I would message you and call her back.  So, please advise if I can schedule her in general and will you see her for both at that one appointment?  [phone]

## 2024-11-05 ENCOUNTER — Encounter: Payer: Self-pay | Admitting: Orthopedic Surgery

## 2024-11-05 ENCOUNTER — Ambulatory Visit: Admitting: Orthopedic Surgery

## 2024-11-05 ENCOUNTER — Other Ambulatory Visit (INDEPENDENT_AMBULATORY_CARE_PROVIDER_SITE_OTHER): Payer: Self-pay

## 2024-11-05 VITALS — Ht 68.0 in | Wt 215.0 lb

## 2024-11-05 DIAGNOSIS — M1711 Unilateral primary osteoarthritis, right knee: Secondary | ICD-10-CM | POA: Diagnosis not present

## 2024-11-05 DIAGNOSIS — G8929 Other chronic pain: Secondary | ICD-10-CM

## 2024-11-05 MED ORDER — METHYLPREDNISOLONE ACETATE 40 MG/ML IJ SUSP
40.0000 mg | Freq: Once | INTRAMUSCULAR | Status: AC
  Administered 2024-11-05: 40 mg via INTRA_ARTICULAR

## 2024-11-05 NOTE — Progress Notes (Signed)
   Chief Complaint  Patient presents with   Knee Pain    Right    Subjective 78 year old female presents with history of acute onset of right knee pain in the setting of chronic osteoarthritis.  There was no trauma.  Patient takes occasional ibuprofen and Tylenol .  She reports longstanding history of difficulty with her right knee.  Patient previously seen by Dr. Brenna  She is currently experiencing more posterior and posterolateral pain with a pulling sensation in the back of her knee.  She denies any radicular symptoms associated with this.  Objective  The patient is alert awake and oriented x 3 mood and affect is normal no assistive devices used for gait  Right knee exam shows tenderness on the posterior part of the knee including the popliteal fossa.  There is mild medial joint line tenderness as well but the patient has excellent range of motion no effusion and the knee remains stable  DG Knee AP/LAT W/Sunrise Right Result Date: 11/05/2024 Right knee imaging X-ray shows narrowing of the medial compartment what looks like still valgus knee alignment 3 compartments have osteophytes there is subchondral sclerosis medially there are posterior osteophytes on the tibia The patellofemoral alignment is normal but peripheral osteophytes are seen on the medial and lateral side of the femur This would qualify as grade 3.5 KL arthritis     Assessment and plan  Encounter Diagnoses  Name Primary?   Chronic pain of right knee    Primary osteoarthritis of right knee Yes    Severe OA right knee (chronic) with acute exacerbation  Treatment options discussed patient is amenable to injecting her right knee    Procedure note for injection   Chief Complaint  Patient presents with   Knee Pain    Right      Encounter Diagnoses  Name Primary?   Chronic pain of right knee    Primary osteoarthritis of right knee Yes        The patient has consented for injection of the right knee  joint  Medication: Depo-Medrol  40 mg and lidocaine  1%  Time out completed: Yes  The site of injection was cleaned with alcohol  and ethyl chloride.  The injection was given without any complications appropriate precautions were given.  Return as needed

## 2024-11-05 NOTE — Patient Instructions (Signed)
 TREATMENT FOR ARTHRITIS WITHOUT SURGERY:  Weight loss Light exercise Physical therapy OTC braces and knee sleeves Topical medications Tylenol  500 mg q6 prn NSAIDS (meloxicam, naproxen , celebrex) Injections  - steroids  -HA ( gel )   Joint Steroid Injection A joint steroid injection is a procedure to relieve swelling and pain in a joint. Steroids are medicines that reduce inflammation. In this procedure, your health care provider uses a syringe and a needle to inject a steroid medicine into a painful and inflamed joint. A pain-relieving medicine (anesthetic) may be injected along with the steroid. In some cases, your health care provider may use an imaging technique such as ultrasound or fluoroscopy to guide the injection. Joints that are often treated with steroid injections include the knee, shoulder, hip, and spine. These injections may also be used in the elbow, ankle, and joints of the hands or feet. You may have joint steroid injections as part of your treatment for inflammation caused by: Gout. Rheumatoid arthritis. Advanced wear-and-tear arthritis (osteoarthritis). Tendinitis. Bursitis. Joint steroid injections may be repeated, but having them too often can damage a joint or the skin over the joint. You should not have joint steroid injections less than 6 weeks apart or more than four times a year. Tell a health care provider about: Any allergies you have. All medicines you are taking, including vitamins, herbs, eye drops, creams, and over-the-counter medicines. Any problems you or family members have had with anesthetic medicines. Any blood disorders you have. Any surgeries you have had. Any medical conditions you have. Whether you are pregnant or may be pregnant. What are the risks? Generally, this is a safe treatment. However, problems may occur, including: Infection. Bleeding. Allergic reactions to medicines. Damage to the joint or tissues around the joint. Thinning of  skin or loss of skin color over the joint. Temporary flushing of the face or chest. Temporary increase in pain. Temporary increase in blood sugar. Failure to relieve inflammation or pain. What happens before the treatment? Medicines Ask your health care provider about: Changing or stopping your regular medicines. This is especially important if you are taking diabetes medicines or blood thinners. Taking medicines such as aspirin and ibuprofen. These medicines can thin your blood. Do not take these medicines unless your health care provider tells you to take them. Taking over-the-counter medicines, vitamins, herbs, and supplements. General instructions You may have imaging tests of your joint. Ask your health care provider if you can drive yourself home after the procedure. What happens during the treatment?  Your health care provider will position you for the injection and locate the injection site over your joint. The skin over the joint will be cleaned with a germ-killing soap. Your health care provider may: Spray a numbing solution (topical anesthetic) over the injection site. Inject a local anesthetic under the skin above your joint. The needle will be placed through your skin into your joint. Your health care provider may use imaging to guide the needle to the right spot for the injection. If imaging is used, a special contrast dye may be injected to confirm that the needle is in the correct location. The steroid medicine will be injected into your joint. Anesthetic may be injected along with the steroid. This may be a medicine that relieves pain for a short time (short-acting anesthetic) or for a longer time (long-acting anesthetic). The needle will be removed, and an adhesive bandage (dressing) will be placed over the injection site. The procedure may vary among health care  providers and hospitals. What can I expect after the treatment? You will be able to go home after the  treatment. It is normal to feel slight flushing for a few days after the injection. After the treatment, it is common to have an increase in joint pain after the anesthetic has worn off. This may happen about an hour after a short-acting anesthetic or about 8 hours after a longer-acting anesthetic. You should begin to feel relief from joint pain and swelling after 24 to 48 hours. Contact your health care provider if you do not begin to feel relief after 2 days. Follow these instructions at home: Injection site care Leave the adhesive dressing over your injection site in place until your health care provider says you can remove it. Check your injection site every day for signs of infection. Check for: More redness, swelling, or pain. Fluid or blood. Warmth. Pus or a bad smell. Activity Return to your normal activities as told by your health care provider. Ask your health care provider what activities are safe for you. You may be asked to limit activities that put stress on the joint for a few days. Do joint exercises as told by your health care provider. Do not take baths, swim, or use a hot tub until your health care provider approves. Ask your health care provider if you may take showers. You may only be allowed to take sponge baths. Managing pain, stiffness, and swelling  If directed, put ice on the joint. To do this: Put ice in a plastic bag. Place a towel between your skin and the bag. Leave the ice on for 20 minutes, 2-3 times a day. Remove the ice if your skin turns bright red. This is very important. If you cannot feel pain, heat, or cold, you have a greater risk of damage to the area. Raise (elevate) your joint above the level of your heart when you are sitting or lying down. General instructions Take over-the-counter and prescription medicines only as told by your health care provider. Do not use any products that contain nicotine or tobacco, such as cigarettes, e-cigarettes, and  chewing tobacco. These can delay joint healing. If you need help quitting, ask your health care provider. If you have diabetes, be aware that your blood sugar may be slightly elevated for several days after the injection. Keep all follow-up visits. This is important. Contact a health care provider if you have: Chills or a fever. Any signs of infection at your injection site. Increased pain or swelling or no relief after 2 days. Summary A joint steroid injection is a treatment to relieve pain and swelling in a joint. Steroids are medicines that reduce inflammation. Your health care provider may add an anesthetic along with the steroid. You may have joint steroid injections as part of your arthritis treatment. Joint steroid injections may be repeated, but having them too often can damage a joint or the skin over the joint. Contact your health care provider if you have a fever, chills, or signs of infection, or if you get no relief from joint pain or swelling. This information is not intended to replace advice given to you by your health care provider. Make sure you discuss any questions you have with your health care provider. Document Revised: 05/19/2020 Document Reviewed: 05/19/2020 Elsevier Patient Education  2024 [employer].

## 2024-11-05 NOTE — Progress Notes (Signed)
 d Intake history:  Chief Complaint  Patient presents with   Knee Pain    Right      Ht 5' 8 (1.727 m)   Wt 215 lb (97.5 kg)   BMI 32.69 kg/m  Body mass index is 32.69 kg/m.  Pharmacy? _______[facility] _______________________________  WHAT ARE WE SEEING YOU FOR TODAY?   Right knee  How long has this bothered you? (DOI?DOS?WS?)  1 month  Was there an injury? No  Anticoag.  No   Any ALLERGIES ____ Allergies  Allergen Reactions   Amoxicillin Hives   Augmentin [Amoxicillin-Pot Clavulanate] Diarrhea   Erythromycin Other (See Comments)    Unknown   Sulfa Antibiotics Other (See Comments)    Bood poisoning   Sulfasalazine Other (See Comments)    blood poisoning   Tetanus Toxoid-Containing Vaccines Other (See Comments)    Muscle swelling   Tussionex Pennkinetic Er [Hydrocod Poli-Chlorphe Poli Er] Itching   __________________________________________   Treatment:  Have you taken:  Tylenol  Yes states takes Tylenol  or Ibuprofen one every few days   Advil Yes  Had PT No  Had injection No  Other  _________________________

## 2024-11-06 DIAGNOSIS — E1122 Type 2 diabetes mellitus with diabetic chronic kidney disease: Secondary | ICD-10-CM | POA: Diagnosis not present

## 2024-11-06 DIAGNOSIS — Z8249 Family history of ischemic heart disease and other diseases of the circulatory system: Secondary | ICD-10-CM | POA: Diagnosis not present

## 2024-11-06 DIAGNOSIS — I129 Hypertensive chronic kidney disease with stage 1 through stage 4 chronic kidney disease, or unspecified chronic kidney disease: Secondary | ICD-10-CM | POA: Diagnosis not present

## 2024-11-06 DIAGNOSIS — E669 Obesity, unspecified: Secondary | ICD-10-CM | POA: Diagnosis not present

## 2024-11-06 DIAGNOSIS — F411 Generalized anxiety disorder: Secondary | ICD-10-CM | POA: Diagnosis not present

## 2024-11-06 DIAGNOSIS — K219 Gastro-esophageal reflux disease without esophagitis: Secondary | ICD-10-CM | POA: Diagnosis not present

## 2024-11-06 DIAGNOSIS — J4489 Other specified chronic obstructive pulmonary disease: Secondary | ICD-10-CM | POA: Diagnosis not present

## 2024-11-06 DIAGNOSIS — F324 Major depressive disorder, single episode, in partial remission: Secondary | ICD-10-CM | POA: Diagnosis not present

## 2024-11-06 DIAGNOSIS — M1990 Unspecified osteoarthritis, unspecified site: Secondary | ICD-10-CM | POA: Diagnosis not present

## 2024-11-10 ENCOUNTER — Encounter: Payer: Self-pay | Admitting: Internal Medicine

## 2024-11-10 ENCOUNTER — Ambulatory Visit: Attending: Internal Medicine | Admitting: Internal Medicine

## 2024-11-10 VITALS — BP 122/80 | HR 67 | Ht 67.75 in | Wt 209.6 lb

## 2024-11-10 DIAGNOSIS — E119 Type 2 diabetes mellitus without complications: Secondary | ICD-10-CM | POA: Diagnosis not present

## 2024-11-10 DIAGNOSIS — R0609 Other forms of dyspnea: Secondary | ICD-10-CM | POA: Insufficient documentation

## 2024-11-10 DIAGNOSIS — I272 Pulmonary hypertension, unspecified: Secondary | ICD-10-CM | POA: Diagnosis not present

## 2024-11-10 DIAGNOSIS — I10 Essential (primary) hypertension: Secondary | ICD-10-CM | POA: Insufficient documentation

## 2024-11-10 DIAGNOSIS — I119 Hypertensive heart disease without heart failure: Secondary | ICD-10-CM

## 2024-11-10 NOTE — Progress Notes (Signed)
 Cardiology Office Note  Date: 11/10/2024   ID: Lindsay Wilkerson, DOB August 04, 1946, MRN [id number]  PCP:  Carlette Benita Area, MD  Cardiologist:  Powell FORBES Sorrow, MD (Inactive) Electrophysiologist:  None   History of Present Illness: Lindsay Wilkerson is a 78 y.o. female  Follow-up of DOE and pulmonary hypertension.  Echo, CT cardiac and cardiac MRI reports reviewed.  Minimally active.  She gets little short of breath if she exerts.  No angina.  No dizziness, syncope, palpitations, leg swelling.  Past Medical History:  Diagnosis Date   Depression, major    Diabetes mellitus without complication (HCC)    Diverticulitis    Hypertension    Urinary bladder incontinence     Past Surgical History:  Procedure Laterality Date   COLONOSCOPY WITH PROPOFOL  N/A 07/18/2022   Procedure: COLONOSCOPY WITH PROPOFOL ;  Surgeon: Eartha Angelia Sieving, MD;  Location: [facility];  Service: Gastroenterology;  Laterality: N/A;  1035 ASA 1   EYE SURGERY     IR FALLOPIAN TUBE CATHETERIZATION Left    1980   POLYPECTOMY  07/18/2022   Procedure: POLYPECTOMY;  Surgeon: Eartha Angelia Sieving, MD;  Location: [facility];  Service: Gastroenterology;;   TONSILLECTOMY     Age 5    Current Outpatient Medications  Medication Sig Dispense Refill   atenolol-chlorthalidone (TENORETIC) 50-25 MG per tablet Take 1 tablet by mouth daily.       cetirizine  (ZYRTEC ) 5 MG tablet Take 1 tablet (5 mg total) by mouth daily. 30 tablet 0   clonazePAM (KLONOPIN) 0.5 MG tablet Take 0.5 tablets by mouth daily as needed (anxiety).     diazepam  (VALIUM ) 2 MG tablet Take one tablet at bedtime for neck spasm. 30 tablet 0   fluticasone  (FLONASE) 50 MCG/ACT nasal spray Place 2 sprays into both nostrils daily.     metformin (FORTAMET) 500 MG (OSM) 24 hr tablet Take 500 mg by mouth 2 (two) times daily with a meal.     mirabegron  ER (MYRBETRIQ ) 25 MG TB24 tablet Take 1 tablet (25 mg total) by mouth daily. 90  tablet 4   potassium chloride  SA (KLOR-CON  M) 20 MEQ tablet Take 1 tablet (20 mEq total) by mouth daily. 5 tablet 0   TRELEGY ELLIPTA 100-62.5-25 MCG/ACT AEPB Inhale 1 puff into the lungs daily.     triamcinolone  cream (KENALOG ) 0.1 % Apply 1 Application topically 2 (two) times daily. 453.6 g 0   No current facility-administered medications for this visit.   Allergies:  Amoxicillin, Augmentin [amoxicillin-pot clavulanate], Erythromycin, Sulfa antibiotics, Sulfasalazine, Tetanus toxoid-containing vaccines, and Tussionex pennkinetic er [hydrocod poli-chlorphe poli er]   Social History: The patient  reports that she has quit smoking. Her smoking use included cigarettes. She has never used smokeless tobacco. She reports current alcohol  use. She reports that she does not use drugs.   Family History: The patient's family history includes Breast cancer in her sister; Heart disease in her brother, father, and mother; Hypertension in her brother, mother, and sister; Kidney disease in her sister; Memory loss in her sister; Rheum arthritis in her sister.   ROS:  Please see the history of present illness. Otherwise, complete review of systems is positive for none.  All other systems are reviewed and negative.   Physical Exam: VS:  BP 122/80   Pulse 67   Ht 5' 7.75 (1.721 m)   Wt 209 lb 9.6 oz (95.1 kg)   SpO2 98%   BMI 32.11 kg/m , BMI Body mass index is  32.11 kg/m.  Wt Readings from Last 3 Encounters:  11/10/24 209 lb 9.6 oz (95.1 kg)  11/05/24 215 lb (97.5 kg)  04/14/24 215 lb (97.5 kg)    General: Patient appears comfortable at rest. HEENT: Conjunctiva and lids normal, oropharynx clear with moist mucosa. Neck: Supple, no elevated JVP or carotid bruits, no thyromegaly. Lungs: Clear to auscultation, nonlabored breathing at rest. Cardiac: Regular rate and rhythm, no S3 or significant systolic murmur, no pericardial rub. Abdomen: Soft, nontender, no hepatomegaly, bowel sounds present, no  guarding or rebound. Extremities: No pitting edema, distal pulses 2+. Skin: Warm and dry. Musculoskeletal: No kyphosis. Neuropsychiatric: Alert and oriented x3, affect grossly appropriate.  Recent Labwork: 06/26/2024: ALT 12; AST 14; BUN 17; Creatinine, Ser 0.92; Hemoglobin 13.7; Platelets 269; Potassium 3.5; Sodium 147     Component Value Date/Time   CHOL 131 03/13/2021 1108   TRIG 88 03/13/2021 1108   HDL 37 (L) 03/13/2021 1108   CHOLHDL 3.5 03/13/2021 1108   VLDL 18 03/13/2021 1108   LDLCALC 76 03/13/2021 1108     Assessment and Plan:  DOE - Patient was initially evaluated by Dr. Hobart in May 2023.  Ischemia evaluation was performed for DOE.  She underwent echocardiogram in November 2023 that showed normal LVEF with moderate LVH, obliteration of the apical cavity with systole but not clear apical variant hypertrophy was noted, normal RV function, no valvular heart disease. She subsequently underwent cardiac MRI that did not reveal any cardiac amyloidosis or HCM.  Moderate LVH was noted to be from chronic longstanding HTN. - CT cardiac in 11/2022 showed coronary calcium score of 0. - Not active, reports having mild DOE with minimal activities but overall compensated.  Likely deconditioned.  Strongly encouraged her to start working out.  Pulmonary hypertension - There was imaging evidence of pulmonary arterial hypertension.  TR jet was inadequate on echocardiogram to calculate PASP.  Not active, reports having mild DOE with minimal activities but overall compensated.  Strongly encouraged working out to rule out physical deconditioning induced DOE, which the patient thinks she probably has.  If her symptoms do not improve with exercise, she knows that she has to reach out to our office.  Then, we will schedule her for RHC.  HTN, controlled - Does not check blood pressures regularly.  At home, blood pressure is usually between 130 and 140 mmHg SBP.  Continue atenolol-chlorthalidone  50-25 mg once daily.  Check blood pressures twice daily, in a.m. and p.m.  5 minutes spent in reviewing prior medical records, labs, reports.  I spent 10 minutes with the patient today in reviewing and discussing the reports.  10 minutes spent in discussing the above problems.  5 minutes in documentation.   Medication Adjustments/Labs and Tests Ordered: Current medicines are reviewed at length with the patient today.  Concerns regarding medicines are outlined above.    Disposition:  Follow up 1.5 years  Signed, Marcena Dias Arleta Maywood, MD, 11/10/2024 11:46 AM    [facility] [address] [place], [place] [address]

## 2024-11-10 NOTE — Patient Instructions (Signed)
 Medication Instructions:  Your physician recommends that you continue on your current medications as directed. Please refer to the Current Medication list given to you today.  *If you need a refill on your cardiac medications before your next appointment, please call your pharmacy*  Lab Work: None If you have labs (blood work) drawn today and your tests are completely normal, you will receive your results only by: MyChart Message (if you have MyChart) OR A paper copy in the mail If you have any lab test that is abnormal or we need to change your treatment, we will call you to review the results.  Testing/Procedures: None  Follow-Up: At [facility], you and your health needs are our priority.  As part of our continuing mission to provide you with exceptional heart care, our providers are all part of one team.  This team includes your primary Cardiologist (physician) and Advanced Practice Providers or APPs (Physician Assistants and Nurse Practitioners) who all work together to provide you with the care you need, when you need it.  Your next appointment:   1.5 year(s)  Provider:   You may see Vishnu Mallipeddi, MD or one of the following Advanced Practice Providers on your designated Care Team:   Brittany Strader, PA-C  [place], [place] Olivia Pavy, [place]     We recommend signing up for the patient portal called MyChart.  Sign up information is provided on this After Visit Summary.  MyChart is used to connect with patients for Virtual Visits (Telemedicine).  Patients are able to view lab/test results, encounter notes, upcoming appointments, etc.  Non-urgent messages can be sent to your provider as well.   To learn more about what you can do with MyChart, go to [email or web address].   Other Instructions

## 2025-01-04 ENCOUNTER — Encounter (INDEPENDENT_AMBULATORY_CARE_PROVIDER_SITE_OTHER): Payer: Self-pay | Admitting: *Deleted

## 2025-01-12 ENCOUNTER — Emergency Department (HOSPITAL_COMMUNITY)

## 2025-01-12 ENCOUNTER — Encounter (HOSPITAL_COMMUNITY): Payer: Self-pay | Admitting: *Deleted

## 2025-01-12 ENCOUNTER — Emergency Department (HOSPITAL_COMMUNITY)
Admission: EM | Admit: 2025-01-12 | Discharge: 2025-01-12 | Disposition: A | Discharge status: Home / Self Care | Attending: Emergency Medicine | Admitting: Emergency Medicine

## 2025-01-12 ENCOUNTER — Other Ambulatory Visit: Payer: Self-pay

## 2025-01-12 DIAGNOSIS — R4182 Altered mental status, unspecified: Secondary | ICD-10-CM | POA: Diagnosis not present

## 2025-01-12 DIAGNOSIS — I729 Aneurysm of unspecified site: Secondary | ICD-10-CM | POA: Insufficient documentation

## 2025-01-12 DIAGNOSIS — R42 Dizziness and giddiness: Secondary | ICD-10-CM | POA: Diagnosis present

## 2025-01-12 DIAGNOSIS — Z79899 Other long term (current) drug therapy: Secondary | ICD-10-CM | POA: Diagnosis not present

## 2025-01-12 DIAGNOSIS — I671 Cerebral aneurysm, nonruptured: Secondary | ICD-10-CM | POA: Diagnosis not present

## 2025-01-12 DIAGNOSIS — Z7984 Long term (current) use of oral hypoglycemic drugs: Secondary | ICD-10-CM | POA: Diagnosis not present

## 2025-01-12 LAB — COMPREHENSIVE METABOLIC PANEL WITH GFR
ALT: 11 U/L (ref 0–44)
AST: 18 U/L (ref 15–41)
Albumin: 4 g/dL (ref 3.5–5.0)
Alkaline Phosphatase: 80 U/L (ref 38–126)
Anion gap: 13 (ref 5–15)
BUN: 15 mg/dL (ref 8–23)
CO2: 23 mmol/L (ref 22–32)
Calcium: 9.3 mg/dL (ref 8.9–10.3)
Chloride: 108 mmol/L (ref 98–111)
Creatinine, Ser: 1.07 mg/dL — ABNORMAL HIGH (ref 0.44–1.00)
GFR, Estimated: 53 mL/min — ABNORMAL LOW
Glucose, Bld: 118 mg/dL — ABNORMAL HIGH (ref 70–99)
Potassium: 3.8 mmol/L (ref 3.5–5.1)
Sodium: 145 mmol/L (ref 135–145)
Total Bilirubin: 0.4 mg/dL (ref 0.0–1.2)
Total Protein: 6.9 g/dL (ref 6.5–8.1)

## 2025-01-12 LAB — URINE DRUG SCREEN
Amphetamines: NEGATIVE
Barbiturates: NEGATIVE
Benzodiazepines: NEGATIVE
Cocaine: NEGATIVE
Fentanyl: NEGATIVE
Methadone Scn, Ur: NEGATIVE
Opiates: NEGATIVE
Tetrahydrocannabinol: NEGATIVE

## 2025-01-12 LAB — DIFFERENTIAL
Abs Immature Granulocytes: 0.01 K/uL (ref 0.00–0.07)
Basophils Absolute: 0 K/uL (ref 0.0–0.1)
Basophils Relative: 0 %
Eosinophils Absolute: 0 K/uL (ref 0.0–0.5)
Eosinophils Relative: 1 %
Immature Granulocytes: 0 %
Lymphocytes Relative: 42 %
Lymphs Abs: 2.1 K/uL (ref 0.7–4.0)
Monocytes Absolute: 0.4 K/uL (ref 0.1–1.0)
Monocytes Relative: 8 %
Neutro Abs: 2.4 K/uL (ref 1.7–7.7)
Neutrophils Relative %: 49 %

## 2025-01-12 LAB — CBG MONITORING, ED
Glucose-Capillary: 120 mg/dL — ABNORMAL HIGH (ref 70–99)
Glucose-Capillary: 120 mg/dL — ABNORMAL HIGH (ref 70–99)

## 2025-01-12 LAB — CBC
HCT: 43.8 % (ref 36.0–46.0)
Hemoglobin: 13.9 g/dL (ref 12.0–15.0)
MCH: 27.3 pg (ref 26.0–34.0)
MCHC: 31.7 g/dL (ref 30.0–36.0)
MCV: 86.1 fL (ref 80.0–100.0)
Platelets: 249 K/uL (ref 150–400)
RBC: 5.09 MIL/uL (ref 3.87–5.11)
RDW: 15.5 % (ref 11.5–15.5)
WBC: 5 K/uL (ref 4.0–10.5)
nRBC: 0 % (ref 0.0–0.2)

## 2025-01-12 LAB — ETHANOL: Alcohol, Ethyl (B): <15 mg/dL

## 2025-01-12 LAB — PROTIME-INR
INR: 0.9 (ref 0.8–1.2)
Prothrombin Time: 12.9 s (ref 11.4–15.2)

## 2025-01-12 LAB — APTT: aPTT: 29 s (ref 24–36)

## 2025-01-12 MED ORDER — MECLIZINE HCL 12.5 MG PO TABS: 12.5000 mg | ORAL_TABLET | Freq: Three times a day (TID) | ORAL | 0 refills | Status: AC | PRN

## 2025-01-12 MED ORDER — IOHEXOL 350 MG/ML SOLN
75.0000 mL | Freq: Once | INTRAVENOUS | Status: AC | PRN
  Administered 2025-01-12: 75 mL via INTRAVENOUS

## 2025-01-12 NOTE — ED Provider Notes (Signed)
 " [facility] AT [facility] Provider Note   CSN: [id number] Arrival date & time: 01/12/25  1818     Patient presents with: Dizziness   Lindsay Wilkerson is a 79 y.o. female.   HPI Patient presents concern of dizziness.  Onset 6 days ago.  Patient seen in urgent care, with provisional diagnosis of medication related symptoms. Patient was previously on antibiotics, steroids for URI. She notes unsteadiness when upright, ambulatory, without profound dizziness with head motion, with no near syncope, and no associated chest pain, abdominal pain or other complaints.    Prior to Admission medications  Medication Sig Start Date End Date Taking? Authorizing Provider  meclizine  (ANTIVERT ) 12.5 MG tablet Take 1 tablet (12.5 mg total) by mouth 3 (three) times daily as needed for dizziness. 01/12/25  Yes Garrick Charleston, MD  atenolol-chlorthalidone (TENORETIC) 50-25 MG per tablet Take 1 tablet by mouth daily.      [provider]  cetirizine  (ZYRTEC ) 5 MG tablet Take 1 tablet (5 mg total) by mouth daily. 07/01/22   Christopher Savannah, PA-C  clonazePAM (KLONOPIN) 0.5 MG tablet Take 0.5 tablets by mouth daily as needed (anxiety).    [provider]  diazepam  (VALIUM ) 2 MG tablet Take one tablet at bedtime for neck spasm. 07/09/23   Brenna Lin, MD  fluticasone  [facility]) 50 MCG/ACT nasal spray Place 2 sprays into both nostrils daily.    [provider]  metformin (FORTAMET) 500 MG (OSM) 24 hr tablet Take 500 mg by mouth 2 (two) times daily with a meal.    [provider]  mirabegron  ER (MYRBETRIQ ) 25 MG TB24 tablet Take 1 tablet (25 mg total) by mouth daily. 05/21/23 11/10/24  Ozan, Jennifer, DO  potassium chloride  SA (KLOR-CON  M) 20 MEQ tablet Take 1 tablet (20 mEq total) by mouth daily. 07/30/22   Towana Ozell BROCKS, MD  TRELEGY ELLIPTA 100-62.5-25 MCG/ACT AEPB Inhale 1 puff into the lungs daily. 01/03/22   [provider]  triamcinolone   cream (KENALOG ) 0.1 % Apply 1 Application topically 2 (two) times daily. 07/09/24   Leath-Warren, Etta PARAS, NP    Allergies: Amoxicillin, Augmentin [amoxicillin-pot clavulanate], Erythromycin, Sulfa antibiotics, Sulfasalazine, Tetanus toxoid-containing vaccines, and Tussionex pennkinetic er [hydrocod poli-chlorphe poli er]    Review of Systems  Updated Vital Signs BP (!) 169/84   Pulse 69   Temp 98.7 F (37.1 C) (Oral)   Resp (!) 22   Ht 1.721 m (5' 7.75)   Wt 95.3 kg   SpO2 98%   BMI 32.17 kg/m   Physical Exam Vitals and nursing note reviewed.  Constitutional:      General: She is not in acute distress.    Appearance: She is well-developed.  HENT:     Head: Normocephalic and atraumatic.  Eyes:     Conjunctiva/sclera: Conjunctivae normal.  Cardiovascular:     Rate and Rhythm: Normal rate and regular rhythm.  Pulmonary:     Effort: Pulmonary effort is normal. No respiratory distress.     Breath sounds: Normal breath sounds. No stridor.  Abdominal:     General: There is no distension.  Skin:    General: Skin is warm and dry.  Neurological:     Mental Status: She is alert and oriented to person, place, and time.     Cranial Nerves: No cranial nerve deficit, dysarthria or facial asymmetry.     Motor: No weakness, tremor, atrophy or abnormal muscle tone.     Coordination: Coordination is intact.  Psychiatric:        Mood and Affect: Mood normal.     (all labs ordered are listed, but only abnormal results are displayed) Labs Reviewed  COMPREHENSIVE METABOLIC PANEL WITH GFR - Abnormal; Notable for the following components:      Result Value   Glucose, Bld 118 (*)    Creatinine, Ser 1.07 (*)    GFR, Estimated 53 (*)    All other components within normal limits  CBG MONITORING, ED - Abnormal; Notable for the following components:   Glucose-Capillary 120 (*)    All other components within normal limits  CBG MONITORING, ED - Abnormal; Notable for the following  components:   Glucose-Capillary 120 (*)    All other components within normal limits  PROTIME-INR  APTT  CBC  DIFFERENTIAL  ETHANOL  URINE DRUG SCREEN    EKG: EKG Interpretation Date/Time:  Wednesday January 12 2025 20:00:00 EST Ventricular Rate:  58 PR Interval:  136 QRS Duration:  106 QT Interval:  426 QTC Calculation: 419 R Axis:   43  Text Interpretation: Sinus rhythm Atrial premature complex T wave abnormality Confirmed by Garrick Charleston [phone]) on 01/12/2025 9:22:19 PM  Radiology: CT ANGIO HEAD NECK W WO CM Result Date: 01/12/2025 EXAM: CTA HEAD AND NECK WITH AND WITHOUT CT HEAD WITHOUT CONTRAST 01/12/2025 09:51:55 PM TECHNIQUE: CTA of the head and neck was performed with and without the administration of intravenous contrast. Multiplanar 2D and/or 3D reformatted images are provided for review. Automated exposure control, iterative reconstruction, and/or weight based adjustment of the mA/kV was utilized to reduce the radiation dose to as low as reasonably achievable. Stenosis of the internal carotid arteries measured using NASCET criteria. COMPARISON: None available CLINICAL HISTORY: Neuro deficit, acute, stroke suspected FINDINGS: CT HEAD: Brain: No evidence of acute large vascular territory, acute hemorrhage, mass lesion, or hydrocephalus. Sinuses: Clear. Bone: No acute fracture. CTA: Limitd study because of venous contrast timing. Within this limitation: AORTIC ARCH AND ARCH VESSELS: No dissection or arterial injury. No significant stenosis of the brachiocephalic or subclavian arteries. CERVICAL CAROTID ARTERIES: No dissection, arterial injury, or hemodynamically significant stenosis by NASCET criteria. CERVICAL VERTEBRAL ARTERIES: No dissection, arterial injury, or significant stenosis. LUNGS AND MEDIASTINUM: Unremarkable. SOFT TISSUES: No acute abnormality. BONES: No acute abnormality. ANTERIOR CIRCULATION: No significant stenosis of the internal carotid arteries. Large (14 mm)  left parclinoid ICA aneurysm which remodels surrounding bone and incorporates the vessel. No significant stenosis of the anterior cerebral arteries. No significant stenosis of the middle cerebral arteries. Poor distal evaluation due to venous contrast timing. 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 POSTERIOR CIRCULATION: No significant stenosis of the posterior cerebral arteries. No significant stenosis of the basilar artery. No significant stenosis of the vertebral arteries. No aneurysm. OTHER: No dural venous sinus thrombosis on this non-dedicated study. IMPRESSION: 1. Large (14 mm) left parclinoid ICA aneurysm. 2. 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 3. Limited study due to to venous contrast timing without visible large vessel occlusion or significant stenosis. 4. No evidence of acute intracranial abnormality. Electronically signed by: Glendia Molt MD 01/12/2025 10:26 PM EST RP Workstation: [id number]     Procedures   Medications Ordered in the ED  iohexol  (OMNIPAQUE ) 350 MG/ML injection 75 mL (75 mLs Intravenous Contrast Given 01/12/25 2151)  Medical Decision Making Pleasant adult female presents with almost 1 week of unsteadiness.  No profound dizziness consistent with vertigo, no medication changes, vertigo, CNS changes are all considerations. Patient with no pain, less likely arrhythmia. Cardiac 75 sinus normal pulse ox 95% room air borderline  Amount and/or Complexity of Data Reviewed External Data Reviewed: notes. Labs: ordered. Decision-making details documented in ED Course. Radiology: ordered and independent interpretation performed. Decision-making details documented in ED Course. ECG/medicine tests: ordered and independent interpretation performed. Decision-making details documented in ED Course.  Risk Prescription drug management. Decision regarding hospitalization. Diagnosis or treatment significantly limited by  social determinants of health.   11:00 PM  Patient in no distress, awake, alert, speaking clearly, vital signs remain unremarkable.  She states that she is ready to go home.  I reviewed all findings, CT angiography in particular discussed them with her, including demonstration of aneurysm, but no evidence for bleed, mass, and given the duration of symptoms, this likely incidental finding, patient will follow-up with neurology and primary care for additional consideration of her dizziness, as well as appropriate setting which may include investigation for occult CVA, though tonight's studies were reassuring, as above.. Patient in no distress, requesting discharge, no new complaints.  Final diagnoses:  Dizziness  Aneurysm    ED Discharge Orders          Ordered    Ambulatory referral to Neurology       Comments: An appointment is requested in approximately: 1 week   01/12/25 2258    meclizine  (ANTIVERT ) 12.5 MG tablet  3 times daily PRN        01/12/25 2258               Garrick Charleston, MD 01/12/25 2300  "

## 2025-01-12 NOTE — Discharge Instructions (Signed)
 Today's evaluation has been generally reassuring.  Your dizziness may be due to vertigo, though with other possibilities it is important to follow-up with both your primary care physician and our neurology colleagues. Notably, today's CAT scan did not demonstrate a mass/tumor, or obvious stroke.  However, there was evidence for a aneurysm which requires follow-up.  Please discuss this with your primary care physician and our neurology colleagues.  The CT scan results are included below:  IMPRESSION:  1. Large (14 mm) left parclinoid ICA aneurysm.  2. 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  3. Limited study due to to venous contrast timing without visible large vessel  occlusion or significant stenosis.  4. No evidence of acute intracranial abnormality.

## 2025-01-12 NOTE — ED Triage Notes (Signed)
 Pt with c/o dizziness since Thursday. Denies hx of vertigo.  Pt has been seen at [facility] and stated pt may have had a reaction to the antibiotic and steroid for URI

## 2025-01-25 ENCOUNTER — Telehealth: Payer: Self-pay | Admitting: Neurosurgery

## 2025-01-25 NOTE — Telephone Encounter (Signed)
 Spoke to patient. Scheduled appointment with Dr Janjua

## 2025-01-25 NOTE — Telephone Encounter (Signed)
Patient would like to schedule an appt.

## 2025-01-25 NOTE — Telephone Encounter (Signed)
 Returned call to patient. No answer/unable to leave voicemail

## 2025-01-26 ENCOUNTER — Encounter (INDEPENDENT_AMBULATORY_CARE_PROVIDER_SITE_OTHER): Admitting: Gastroenterology

## 2025-01-27 ENCOUNTER — Encounter (INDEPENDENT_AMBULATORY_CARE_PROVIDER_SITE_OTHER): Payer: Self-pay | Admitting: *Deleted

## 2025-01-28 ENCOUNTER — Encounter: Payer: Self-pay | Admitting: Neurosurgery

## 2025-01-28 ENCOUNTER — Ambulatory Visit: Admitting: Neurosurgery

## 2025-01-28 NOTE — Progress Notes (Unsigned)
 " Assessment : 79 y.o. female PMHx significant for Diabetes , Hypertension , and COPD. Quit smoking in 2010.   Starting in 01/06/25 experienced some dizziness. Unsteady when upright, ambulatory, profound dizziness with head motion, no syncope/CP.   On 01/12/25 She went to [facility] ED for her symptoms where they got a CTA that fnound a Large (14mm) left parclinoid ICA aneurysm and 4.5cm left MCA bifurcation aneurysm.  She was told to follow up with PCP and neurology for dizziness and subsequently discharged without medication.   Today the patient presents with following up in the clinic per referral from her PCP Carlette San MD. She reports a slight headache this morning but that this is the first time it has happened in her clinical course. She denies issues with her vision.  Reports that she still has a little dizziness but that it has gotten better.     Plan :  I was able to review the images with the patient and showed her that she has this very large left paraclinioid aneurysm as well as an MCA bifurcation aneurysm.I explained to her that her symptoms of diziness are unrelated to this, but are more likely related to her blood pressure. I went over the options with her.   First option would be is to do nothing. The benefit of this would be is that she would not take any risk of 79 treatment. However, given the size of this aneurysm, I'm would be concerned about pursuing that option.   Second option would be is to pursue observation with serial imaging. Given the fact that the aneurysm is so large, I don't think that further observation at the age of 79 is going to make any significant difference in our decision making.   Last option would be an endovascular treatment for this aneurysm. She would pursue this if she wanted to have this treated and wanted to lower the risk of rupture. However, the treatment carries with it a small risk of less than five percent of complications from treatment, but  also she would have to be on long term anti-platelet therapy.  Understandably so she was very emotional about this and I recommended that she goes home and thinks about this and returns next week with her family.  She has a cousin and some other family who can accompany her.  After that, we will have this conversation again and allow her to make a decision and what is right for her   Social History   Socioeconomic History   Marital status: Divorced    Spouse name: Not on file   Number of children: 0   Years of education: Not on file   Highest education level: Not on file  Occupational History   Not on file  Tobacco Use   Smoking status: Former    Types: Cigarettes   Smokeless tobacco: Never  Vaping Use   Vaping status: Never Used  Substance and Sexual Activity   Alcohol  use: Yes    Comment: occasionally   Drug use: No   Sexual activity: Yes    Birth control/protection: Post-menopausal  Other Topics Concern   Not on file  Social History Narrative   Not on file   Social Drivers of Health   Tobacco Use: Medium Risk (01/28/2025)   Patient History    Smoking Tobacco Use: Former    Smokeless Tobacco Use: Never    Passive Exposure: Not on Actuary Strain: Not on file  Food Insecurity: No  Food Insecurity (03/13/2024)   Hunger Vital Sign    Worried About Running Out of Food in the Last Year: Never true    Ran Out of Food in the Last Year: Never true  Transportation Needs: No Transportation Needs (03/13/2024)   PRAPARE - Administrator, Civil Service (Medical): No    Lack of Transportation (Non-Medical): No  Physical Activity: Not on file  Stress: Not on file  Social Connections: Not on file  Intimate Partner Violence: Not At Risk (03/13/2024)   Humiliation, Afraid, Rape, and Kick questionnaire    Fear of Current or Ex-Partner: No    Emotionally Abused: No    Physically Abused: No    Sexually Abused: No  Depression (PHQ2-9): Not on file  Alcohol   Screen: Not on file  Housing: Low Risk (03/13/2024)   Housing Stability Vital Sign    Unable to Pay for Housing in the Last Year: No    Number of Times Moved in the Last Year: 0    Homeless in the Last Year: No  Utilities: Not At Risk (03/13/2024)   AHC Utilities    Threatened with loss of utilities: No  Health Literacy: Not on file    Family History  Problem Relation Age of Onset   Heart disease Father    Hypertension Mother    Heart disease Mother    Heart disease Brother    Hypertension Brother    Hypertension Sister    Kidney disease Sister    Breast cancer Sister    Rheum arthritis Sister    Memory loss Sister     Allergies[1]  Past Medical History:  Diagnosis Date   Depression, major    Diabetes mellitus without complication (HCC)    Diverticulitis    Hypertension    Urinary bladder incontinence     Past Surgical History:  Procedure Laterality Date   COLONOSCOPY WITH PROPOFOL  N/A 07/18/2022   Procedure: COLONOSCOPY WITH PROPOFOL ;  Surgeon: Eartha Angelia Sieving, MD;  Location: [facility];  Service: Gastroenterology;  Laterality: N/A;  1035 ASA 1   EYE SURGERY     IR FALLOPIAN TUBE CATHETERIZATION Left    1980   POLYPECTOMY  07/18/2022   Procedure: POLYPECTOMY;  Surgeon: Eartha Angelia Sieving, MD;  Location: [facility];  Service: Gastroenterology;;   TONSILLECTOMY     Age 65     Physical Exam   Physical Exam HENT:     Head: Normocephalic.     Nose: Nose normal.  Eyes:     Pupils: Pupils are equal, round, and reactive to light.  Cardiovascular:     Rate and Rhythm: Normal rate.  Pulmonary:     Effort: Pulmonary effort is normal.  Abdominal:     General: Abdomen is flat.  Musculoskeletal:     Cervical back: Normal range of motion.  Neurological:     Mental Status: Patient is alert.     Cranial Nerves: Cranial nerves 2-12 are intact.     Sensory: Sensation is intact.     Motor: Motor function is intact.     Coordination: Coordination  is intact.     No results found for this or any previous visit.     [1]  Allergies Allergen Reactions   Amoxicillin Hives   Augmentin [Amoxicillin-Pot Clavulanate] Diarrhea   Erythromycin Other (See Comments)    Unknown   Sulfa Antibiotics Other (See Comments)    Bood poisoning   Sulfasalazine Other (See Comments)  blood poisoning   Tetanus Toxoid-Containing Vaccines Other (See Comments)    Muscle swelling   Tussionex Pennkinetic Er [Hydrocod Poli-Chlorphe Poli Er] Itching   "

## 2025-02-01 ENCOUNTER — Ambulatory Visit: Admitting: Neurosurgery
